# Patient Record
Sex: Female | Born: 1985
Health system: Southern US, Community
[De-identification: ages and names within clinical notes are randomized; demographics above are authoritative.]

## PROBLEM LIST (undated history)

## (undated) DIAGNOSIS — N760 Acute vaginitis: Secondary | ICD-10-CM

## (undated) DIAGNOSIS — N390 Urinary tract infection, site not specified: Secondary | ICD-10-CM

## (undated) DIAGNOSIS — E039 Hypothyroidism, unspecified: Secondary | ICD-10-CM

## (undated) DIAGNOSIS — D649 Anemia, unspecified: Secondary | ICD-10-CM

## (undated) DIAGNOSIS — R51 Headache: Secondary | ICD-10-CM

## (undated) DIAGNOSIS — O24419 Gestational diabetes mellitus in pregnancy, unspecified control: Secondary | ICD-10-CM

## (undated) DIAGNOSIS — B9689 Other specified bacterial agents as the cause of diseases classified elsewhere: Secondary | ICD-10-CM

## (undated) DIAGNOSIS — F988 Other specified behavioral and emotional disorders with onset usually occurring in childhood and adolescence: Secondary | ICD-10-CM

## (undated) DIAGNOSIS — E669 Obesity, unspecified: Secondary | ICD-10-CM

## (undated) DIAGNOSIS — T7840XA Allergy, unspecified, initial encounter: Secondary | ICD-10-CM

## (undated) DIAGNOSIS — L309 Dermatitis, unspecified: Secondary | ICD-10-CM

## (undated) HISTORY — DX: Dermatitis, unspecified: L30.9

## (undated) HISTORY — DX: Other specified behavioral and emotional disorders with onset usually occurring in childhood and adolescence: F98.8

## (undated) HISTORY — DX: Headache: R51

## (undated) HISTORY — PX: WISDOM TOOTH EXTRACTION: SHX21

## (undated) HISTORY — DX: Obesity, unspecified: E66.9

## (undated) HISTORY — DX: Allergy, unspecified, initial encounter: T78.40XA

## (undated) HISTORY — DX: Hypothyroidism, unspecified: E03.9

---

## 2007-03-07 ENCOUNTER — Other Ambulatory Visit: Admission: RE | Admit: 2007-03-07 | Discharge: 2007-03-07 | Payer: Self-pay | Admitting: Gynecology

## 2008-01-16 ENCOUNTER — Inpatient Hospital Stay (HOSPITAL_COMMUNITY): Admission: AD | Admit: 2008-01-16 | Discharge: 2008-01-18 | Payer: Self-pay | Admitting: Obstetrics and Gynecology

## 2008-11-07 ENCOUNTER — Ambulatory Visit: Payer: Self-pay | Admitting: Family Medicine

## 2009-03-10 ENCOUNTER — Ambulatory Visit: Payer: Self-pay | Admitting: Family Medicine

## 2009-03-12 ENCOUNTER — Ambulatory Visit: Payer: Self-pay | Admitting: Family Medicine

## 2009-07-31 ENCOUNTER — Ambulatory Visit: Payer: Self-pay | Admitting: Family Medicine

## 2009-09-18 ENCOUNTER — Ambulatory Visit: Payer: Self-pay | Admitting: Family Medicine

## 2009-10-24 ENCOUNTER — Ambulatory Visit: Payer: Self-pay | Admitting: Family Medicine

## 2010-01-22 ENCOUNTER — Ambulatory Visit: Payer: Self-pay | Admitting: Family Medicine

## 2010-03-10 ENCOUNTER — Ambulatory Visit: Payer: Self-pay | Admitting: Family Medicine

## 2010-04-27 ENCOUNTER — Emergency Department (HOSPITAL_COMMUNITY): Admission: EM | Admit: 2010-04-27 | Discharge: 2010-04-27 | Payer: Self-pay | Admitting: Family Medicine

## 2010-04-29 ENCOUNTER — Emergency Department (HOSPITAL_BASED_OUTPATIENT_CLINIC_OR_DEPARTMENT_OTHER): Admission: EM | Admit: 2010-04-29 | Discharge: 2010-04-29 | Payer: Self-pay | Admitting: Emergency Medicine

## 2010-10-15 LAB — POCT URINALYSIS DIPSTICK
Nitrite: NEGATIVE
Specific Gravity, Urine: 1.02 (ref 1.005–1.030)
Urobilinogen, UA: 0.2 mg/dL (ref 0.0–1.0)
pH: 6.5 (ref 5.0–8.0)

## 2010-10-15 LAB — GC/CHLAMYDIA PROBE AMP, GENITAL: Chlamydia, DNA Probe: NEGATIVE

## 2010-10-15 LAB — URINE CULTURE: Culture  Setup Time: 201109262004

## 2010-10-15 LAB — WET PREP, GENITAL: Trich, Wet Prep: NONE SEEN

## 2010-10-15 LAB — HEMOCCULT GUIAC POC 1CARD (OFFICE): Fecal Occult Bld: NEGATIVE

## 2010-10-29 ENCOUNTER — Ambulatory Visit (INDEPENDENT_AMBULATORY_CARE_PROVIDER_SITE_OTHER): Payer: 59 | Admitting: Family Medicine

## 2010-10-29 DIAGNOSIS — J309 Allergic rhinitis, unspecified: Secondary | ICD-10-CM

## 2010-12-15 NOTE — Discharge Summary (Signed)
[name], [name]                   ACCOUNT NO.:  [id number]   MEDICAL RECORD NO.:  [id number]          PATIENT TYPE:  INP   LOCATION:  9109                          FACILITY:  [facility]   PHYSICIAN:  Huel Cote, M.D. DATE OF BIRTH:  03-26-1986   DATE OF ADMISSION:  01/16/2008  DATE OF DISCHARGE:  01/18/2008                               DISCHARGE SUMMARY   DISCHARGE DIAGNOSES:  1. Term pregnancy at 24 plus weeks.  2. Vaginal delivery.   DISCHARGE MEDICATIONS:  1. Motrin 600 mg p.o. every 6 hours.  2. Percocet 1-2 tablets p.o. every 4 hours p.r.n.   DISCHARGE FOLLOW-UP:  The patient is to follow up in the office in 6  weeks for her routine postpartum exam.   HOSPITAL COURSE:  The patient is 25 year old G2 P0-0-1-0, who was  admitted at 8 plus weeks' gestation with complaint of rupture of  membranes of clear fluid.  She had a few contractions.  Prenatal care  had been uncomplicated.  She transferred in late to our practice from  [place] where she was in school.  At around 35 weeks, she did  have appropriate prenatal care with the only risk factor being an  increased risk of trisomy 28 on her second trimester screen.  Her level  II ultrasound was normal and she chose to pursue this no further.   PAST MEDICAL HISTORY:  None.   PAST SURGICAL HISTORY:  None.   PAST GYN HISTORY:  No abnormal Pap smears.   PAST OB HISTORY:  One elective abortion.   She had no allergies.   MEDICATIONS:  Prenatal vitamins only.   She had the prenatal labs as follows; A+ antibody negative, sickle  negative, RPR nonreactive, rubella immune, hepatitis B surface antigen  negative, HIV negative, GC negative, chlamydia negative, group B strep  negative.  Her 1-hour Glucola was elevated at 154 with her 3-hour was  normal.   On admission, she was afebrile with stable vital signs.  Cervix was 2-3,  80, and a -2 station with some contractions noted.  She progressed on  her own to approximately 4  cm and received an epidural.  At that point,  she had held at 4 cm for some time.  Therefore, an IUPC was placed and  she was put on Pitocin.  She continued to dilate, reached complete  dilation and felt some increased pressure.  At this point, we attempted  pushing.  However, the vertex was still quite high at a -1 station and  OP and the patient was pushing very poorly.  Therefore, we elected to  redose her epidural and place her in exaggerated Sims and hope the baby  would rotate down the birth canal.  Approximately 1 hour after that  check, I was called by the staff for delivery and the patient was found  by our nurse to be complete and +3, and before I could arrive Dr. Shawnie Pons  she was in the hospital attended the delivery approximately 4 minutes  after my page.  I reached before the placenta delivered and that  delivered shortly thereafter the baby.  The patient was examined, had a  first-degree laceration which was repaired with 2-0 Vicryl with  lidocaine block for hemostasis.  Cervix and rectum were intact.  Estimated blood loss 350 mL.  The patient was then admitted for routine  postpartum care and did quite well.  On postpartum day #1, her  hemoglobin was noted to be 9.6 down from 10 and she really was doing  quite well.      Huel Cote, M.D.  Electronically Signed     KR/MEDQ  D:  01/18/2008  T:  01/18/2008  Job:  [id number]

## 2011-02-22 ENCOUNTER — Other Ambulatory Visit: Payer: 59

## 2011-02-22 DIAGNOSIS — Z0000 Encounter for general adult medical examination without abnormal findings: Secondary | ICD-10-CM

## 2011-02-26 ENCOUNTER — Other Ambulatory Visit: Payer: 59

## 2011-02-26 DIAGNOSIS — Z0000 Encounter for general adult medical examination without abnormal findings: Secondary | ICD-10-CM

## 2011-02-26 LAB — TB SKIN TEST

## 2011-02-26 NOTE — Progress Notes (Signed)
Addended by: Dorthula Perfect on: 02/26/2011 04:44 PM   Modules accepted: Orders

## 2011-03-01 LAB — TB SKIN TEST: TB Skin Test: NEGATIVE mm

## 2011-04-21 ENCOUNTER — Ambulatory Visit: Payer: 59 | Admitting: Family Medicine

## 2011-04-23 ENCOUNTER — Encounter: Payer: Self-pay | Admitting: Family Medicine

## 2011-04-26 ENCOUNTER — Other Ambulatory Visit: Payer: Self-pay | Admitting: Family Medicine

## 2011-04-26 ENCOUNTER — Ambulatory Visit (INDEPENDENT_AMBULATORY_CARE_PROVIDER_SITE_OTHER): Payer: 59 | Admitting: Family Medicine

## 2011-04-26 ENCOUNTER — Encounter: Payer: Self-pay | Admitting: Family Medicine

## 2011-04-26 VITALS — BP 118/74 | HR 78 | Wt 228.0 lb

## 2011-04-26 DIAGNOSIS — Z23 Encounter for immunization: Secondary | ICD-10-CM

## 2011-04-26 DIAGNOSIS — R946 Abnormal results of thyroid function studies: Secondary | ICD-10-CM

## 2011-04-26 DIAGNOSIS — J069 Acute upper respiratory infection, unspecified: Secondary | ICD-10-CM

## 2011-04-26 NOTE — Progress Notes (Signed)
  Subjective:    Patient ID: [name], female    DOB: January 21, 1986, 25 y.o.   MRN: [id number]  HPI She has a one-week history of runny nose, nasal congestion, slight cough, ear congestion with popping. No sore throat or cough. She also recently saw her gynecologist and had blood work done for evaluation of symptoms of fatigue, dryness of her skin and inability to lose weight.  Review of Systems     Objective:   Physical Exam alert and in no distress. Tympanic membranes and canals are normal. Throat is clear. Tonsils are normal. Neck is supple without adenopathy or thyromegaly. Cardiac exam shows a regular sinus rhythm without murmurs or gallops. Lungs are clear to auscultation. Nasal mucosa is normal with no tenderness over sinuses. DTRs are normal. The blood work was reviewed and does show a slightly elevated TSH with other indices being normal.       Assessment & Plan:   1. Nonspecific abnormal results of thyroid function study  Thyroid Panel with TSH, Thyroid peroxidase antibody  2. URI, acute     Supportive care for the URI.

## 2011-04-27 ENCOUNTER — Telehealth: Payer: Self-pay

## 2011-04-27 LAB — THYROID PANEL WITH TSH - CHCC: T3 Uptake: 26.4 % (ref 22.5–37.0)

## 2011-04-27 NOTE — Telephone Encounter (Signed)
Pt has appt to discuss labs per [employer]

## 2011-04-29 LAB — CBC
HCT: 29 — ABNORMAL LOW
HCT: 29.5 — ABNORMAL LOW
Hemoglobin: 10 — ABNORMAL LOW
Hemoglobin: 9.6 — ABNORMAL LOW
MCHC: 33.2
MCHC: 33.8
MCV: 86.7
MCV: 88.2
Platelets: 270
RBC: 3.4 — ABNORMAL LOW
RDW: 16.2 — ABNORMAL HIGH
WBC: 7.3

## 2011-04-29 LAB — RPR: RPR Ser Ql: NONREACTIVE

## 2011-05-03 ENCOUNTER — Ambulatory Visit (INDEPENDENT_AMBULATORY_CARE_PROVIDER_SITE_OTHER): Payer: 59 | Admitting: Family Medicine

## 2011-05-03 ENCOUNTER — Encounter: Payer: Self-pay | Admitting: Family Medicine

## 2011-05-03 VITALS — BP 100/70 | HR 84 | Wt 231.0 lb

## 2011-05-03 DIAGNOSIS — E669 Obesity, unspecified: Secondary | ICD-10-CM

## 2011-05-03 DIAGNOSIS — E039 Hypothyroidism, unspecified: Secondary | ICD-10-CM | POA: Insufficient documentation

## 2011-05-03 MED ORDER — LEVOTHYROXINE SODIUM 100 MCG PO TABS: 100.0000 ug | ORAL_TABLET | Freq: Every day | ORAL | Status: DC

## 2011-05-03 NOTE — Progress Notes (Signed)
  Subjective:    Patient ID: [name], female    DOB: 1985-10-16, 25 y.o.   MRN: [id number]  HPI She is here for consult. Recent blood work did show slightly elevated TSH. Her TPO was also elevated. She also has concerns over this contributing to her weight gain.   Review of Systems     Objective:   Physical Exam Alert and in no distress otherwise not examined       Assessment & Plan:   1. Hypothyroid   2. Obesity (BMI 30-39.9)    I discussed the thyroid condition and the implications of TPO with her. I will place her on Synthroid and recommend she return here in 2 months for recheck. We also discussed her weight gain in regard to making permanent lifestyle changes. Encouraged her to not use the scales as a measure of how well she is doing.

## 2011-05-03 NOTE — Patient Instructions (Signed)
Take her thyroid medicine on a daily basis. Check with the pharmacist on the best time to take it and with or without food Work on making diet and exercise changes but do it permanently. Don't ever hop on another scale normal . See how your clothes fitting

## 2011-06-01 ENCOUNTER — Other Ambulatory Visit: Payer: Self-pay | Admitting: Internal Medicine

## 2011-06-01 DIAGNOSIS — R109 Unspecified abdominal pain: Secondary | ICD-10-CM

## 2011-06-04 ENCOUNTER — Ambulatory Visit
Admission: RE | Admit: 2011-06-04 | Discharge: 2011-06-04 | Disposition: A | Discharge status: Home / Self Care | Payer: 59 | Source: Ambulatory Visit | Attending: Internal Medicine | Admitting: Internal Medicine

## 2011-06-04 DIAGNOSIS — R109 Unspecified abdominal pain: Secondary | ICD-10-CM

## 2011-07-06 ENCOUNTER — Ambulatory Visit: Payer: 59 | Admitting: Family Medicine

## 2011-07-29 ENCOUNTER — Ambulatory Visit: Payer: 59 | Admitting: Family Medicine

## 2011-10-03 ENCOUNTER — Other Ambulatory Visit: Payer: Self-pay | Admitting: Family Medicine

## 2011-10-04 ENCOUNTER — Telehealth: Payer: Self-pay | Admitting: Internal Medicine

## 2011-10-04 MED ORDER — LEVOTHYROXINE SODIUM 100 MCG PO TABS: 100.0000 ug | ORAL_TABLET | Freq: Every day | ORAL | Status: DC

## 2011-10-04 NOTE — Telephone Encounter (Signed)
cheri refilled it with a 30 day supply so i just resent it for a 90 day supply with no refills

## 2011-11-11 ENCOUNTER — Encounter (HOSPITAL_COMMUNITY): Payer: Self-pay | Admitting: Emergency Medicine

## 2011-11-11 ENCOUNTER — Emergency Department (INDEPENDENT_AMBULATORY_CARE_PROVIDER_SITE_OTHER)
Admission: EM | Admit: 2011-11-11 | Discharge: 2011-11-11 | Disposition: A | Discharge status: Home / Self Care | Payer: Self-pay | Source: Home / Self Care | Attending: Emergency Medicine | Admitting: Emergency Medicine

## 2011-11-11 DIAGNOSIS — A6000 Herpesviral infection of urogenital system, unspecified: Secondary | ICD-10-CM

## 2011-11-11 HISTORY — DX: Other specified bacterial agents as the cause of diseases classified elsewhere: B96.89

## 2011-11-11 HISTORY — DX: Other specified bacterial agents as the cause of diseases classified elsewhere: N76.0

## 2011-11-11 HISTORY — DX: Urinary tract infection, site not specified: N39.0

## 2011-11-11 LAB — POCT URINALYSIS DIP (DEVICE)
Nitrite: NEGATIVE
Protein, ur: 30 mg/dL — AB
Urobilinogen, UA: 1 mg/dL (ref 0.0–1.0)
pH: 6.5 (ref 5.0–8.0)

## 2011-11-11 LAB — POCT PREGNANCY, URINE: Preg Test, Ur: NEGATIVE

## 2011-11-11 LAB — WET PREP, GENITAL: Yeast Wet Prep HPF POC: NONE SEEN

## 2011-11-11 MED ORDER — CEFTRIAXONE SODIUM 250 MG IJ SOLR
250.0000 mg | Freq: Once | INTRAMUSCULAR | Status: AC
  Administered 2011-11-11: 250 mg via INTRAMUSCULAR

## 2011-11-11 MED ORDER — LIDOCAINE 4 % EX GEL: 1.0000 "application " | Freq: Three times a day (TID) | CUTANEOUS | Status: DC | PRN

## 2011-11-11 MED ORDER — AZITHROMYCIN 250 MG PO TABS
ORAL_TABLET | ORAL | Status: AC
  Filled 2011-11-11: qty 4

## 2011-11-11 MED ORDER — CEFTRIAXONE SODIUM 250 MG IJ SOLR
INTRAMUSCULAR | Status: AC
  Filled 2011-11-11: qty 250

## 2011-11-11 MED ORDER — AZITHROMYCIN 250 MG PO TABS
1000.0000 mg | ORAL_TABLET | Freq: Once | ORAL | Status: AC
  Administered 2011-11-11: 1000 mg via ORAL

## 2011-11-11 MED ORDER — ACYCLOVIR 400 MG PO TABS: 400.0000 mg | ORAL_TABLET | Freq: Three times a day (TID) | ORAL | Status: AC

## 2011-11-11 NOTE — Discharge Instructions (Signed)
Take the medication as written. Give us a working phone number so that we can contact you if needed. Refrain from sexual contact until you know your results and your partner(s) are treated. Return if you get worse, have a fever >100.4, or for any concerns.  ° °Go to www.goodrx.com to look up your medications. This will give you a list of where you can find your prescriptions at the most affordable prices.  °

## 2011-11-11 NOTE — ED Provider Notes (Signed)
History     CSN: [id number]  Arrival date & time 11/11/11  1007   First MD Initiated Contact with Patient 11/11/11 1054      Chief Complaint  Patient presents with  . Abdominal Pain  . Vaginal Discharge    (Consider location/radiation/quality/duration/timing/severity/associated sxs/prior treatment) HPI Comments: Pt with several days oderous vaginal discharge, dysuria. Notes genital blisters, vaginal itching.  No urgency, frequency,  oderous urine, hematuria. Patient reports fevers Tmax 101 about a week ago, with some generalized body aches. This has since resolved. Complains of occasional N/V, intermittent, sharp, nonradiating lower abdominal pain starting around this time. He is tolerating by mouth. No back pain. No recent abx use. Pt sexually active with new female partner who is  asxatic. Did not use condoms. STD's a concern today. Has a history of BV. No history of gonorrhea chlamydia trichmonoas yeast infection. No h/o syphilis, herpes, HIV.    Patient is a 26 y.o. female presenting with abdominal pain and vaginal discharge. The history is provided by the patient. No language interpreter was used.  Abdominal Pain The primary symptoms of the illness include abdominal pain, dysuria and vaginal discharge. The primary symptoms of the illness do not include fever or vaginal bleeding. The current episode started more than 2 days ago. The onset of the illness was sudden.  The dysuria is associated with vaginal pain. The dysuria is not associated with hematuria, frequency or urgency.  The vaginal discharge is associated with dysuria.  The patient states that she believes she is currently not pregnant. The patient has not had a change in bowel habit. Symptoms associated with the illness do not include urgency, hematuria or frequency.  Vaginal Discharge Associated symptoms include abdominal pain.    Past Medical History  Diagnosis Date  . Eczema   . Allergy     RHINITIS  . Headache   .  ADD (attention deficit disorder)   . BV (bacterial vaginosis)   . UTI (lower urinary tract infection)     History reviewed. No pertinent past surgical history.  Family History  Problem Relation Age of Onset  . Hypertension Maternal Grandmother   . Heart disease Maternal Grandmother   . Kidney disease Maternal Grandmother     History  Substance Use Topics  . Smoking status: Never Smoker   . Smokeless tobacco: Never Used  . Alcohol Use: No    OB History    Grav Para Term Preterm Abortions TAB SAB Ect Mult Living                  Review of Systems  Constitutional: Negative for fever.  HENT: Negative for sore throat and mouth sores.   Gastrointestinal: Positive for abdominal pain.  Genitourinary: Positive for dysuria, vaginal discharge and vaginal pain. Negative for urgency, frequency, hematuria, flank pain and vaginal bleeding.  Musculoskeletal: Negative for arthralgias.  Skin: Positive for rash.    Allergies  Review of patient's allergies indicates no known allergies.  Home Medications   Current Outpatient Rx  Name Route Sig Dispense Refill  . ACYCLOVIR 400 MG PO TABS Oral Take 1 tablet (400 mg total) by mouth 3 (three) times daily. X 10 days 30 tablet 0  . LEVOTHYROXINE SODIUM 100 MCG PO TABS Oral Take 1 tablet (100 mcg total) by mouth daily. 90 tablet 0  . LIDOCAINE 4 % EX GEL Apply externally Apply 1 application topically 3 (three) times daily as needed. Apply with swab. Use smallest effective amount.. Do not exceed  5 gm. 10 g 0  . LORATADINE 10 MG PO TABS Oral Take 10 mg by mouth daily.        BP 137/77  Pulse 62  Temp(Src) 97.9 F (36.6 C) (Oral)  Resp 14  SpO2 100%  LMP 10/15/2011  Physical Exam  Nursing note and vitals reviewed. Constitutional: She is oriented to person, place, and time. She appears well-developed and well-nourished. No distress.  HENT:  Head: Normocephalic and atraumatic.  Eyes: EOM are normal.  Neck: Normal range of motion.    Cardiovascular: Normal rate.   Pulmonary/Chest: Effort normal.  Abdominal: Soft. Bowel sounds are normal. She exhibits no distension. There is no tenderness. There is no rebound, no guarding and no CVA tenderness.  Genitourinary: Uterus normal. Pelvic exam was performed with patient supine. There is rash on the right labia. There is rash on the left labia. Uterus is not tender. Cervix exhibits no motion tenderness and no friability. Right adnexum displays no mass, no tenderness and no fullness. Left adnexum displays no mass, no tenderness and no fullness. No erythema, tenderness or bleeding around the vagina. No foreign body around the vagina. Vaginal discharge found.       Multiple painful, shallow ulcers on the left labia, along vaginal walls, and on cervix. Yellowish and nonodorous vaginal discharge. .Chaperone present during exam  Musculoskeletal: Normal range of motion.  Lymphadenopathy:       Right: No inguinal adenopathy present.       Left: No inguinal adenopathy present.  Neurological: She is alert and oriented to person, place, and time.  Skin: Skin is warm and dry.  Psychiatric: She has a normal mood and affect. Her behavior is normal. Judgment and thought content normal.    ED Course  Procedures (including critical care time)  Labs Reviewed  POCT URINALYSIS DIP (DEVICE) - Abnormal; Notable for the following:    Hgb urine dipstick SMALL (*)    Protein, ur 30 (*)    Leukocytes, UA SMALL (*) Biochemical Testing Only. Please order routine urinalysis from main lab if confirmatory testing is needed.   All other components within normal limits  POCT PREGNANCY, URINE  GC/CHLAMYDIA PROBE AMP, GENITAL  WET PREP, GENITAL  HERPES SIMPLEX VIRUS CULTURE  RPR  HIV ANTIBODY (ROUTINE TESTING)  HSV 2 ANTIBODY, IGG   No results found.   1. Primary genital herpes simplex infection       MDM  H&P most c/w  herpes .  Sent off GC/chlamydia, wet prep, herpes culture. Unsure if got  adequate sample, but patient has painful ulcers on the vaginal wall and on her cervix. Patient declined HIV, RPR. Will  treat empirically now. Giving ceftriaxone 250 mg IM/azithro 1 gm po. Will send home with acyclovir and topical anesthetics. Advised pt to refrain from sexual contact until she knows lab results, symptoms resolve, and partner(s) are treated. Pt provided working phone number. Pt agrees.     Luiz Blare, MD 11/11/11 [phone]

## 2011-11-11 NOTE — ED Notes (Signed)
WENT TO OBTAIN BLOOD SPECIMEN, PATIENT REFUSED TEST

## 2011-11-11 NOTE — ED Notes (Signed)
PT HERE WITH LOWER ABDOMINAL PAIN ,NONRADIATING WITH YELLOW VAG D/C,ODOR AND INTERMITT N/V,CHILLS AND FEVER THAT STARTED A WEEK AGO.PT STATES SHE HAD UNPROTECTED SEX X 3WEEK AGO.LMP 10/15/11.PT ALSO REPORTS OF BURN,ITCH AND VAG IRRITATION

## 2011-11-12 ENCOUNTER — Ambulatory Visit: Payer: 59 | Admitting: Medical

## 2011-11-15 LAB — HERPES SIMPLEX VIRUS CULTURE

## 2011-11-15 NOTE — ED Notes (Signed)
GC Indeterminate, Chlamydia neg., Wet prep: Few clue cells, WBC's TNTC, Herpes culture pending.  Pt. adequately treated with Rocephin and Zithromax. No DHHS required for Indeterminate results. Will contact pt. when all of labs are back. [name] 11/15/2011

## 2011-11-16 ENCOUNTER — Telehealth (HOSPITAL_COMMUNITY): Payer: Self-pay | Admitting: *Deleted

## 2011-11-16 NOTE — ED Notes (Signed)
Herpes culture: Herpes simplex Type 2 detected. Pt. adequately treated with Acyclovir.  I called and left a message for pt. to call. [name] 11/16/2011

## 2011-11-16 NOTE — ED Notes (Signed)
Pt. called back.  Pt. verified x 2 and given results. Pt. told she was adequately treated with the Rocephin, Zithromax and Zovirax. Pt. instructed to notify her partner, no sex for 1 week and to practice safe sex. Pt. told they can get HIV testing at the [facility]. STD clinic. Pt. Instructed that she can pass the virus even when you don't have an outbreak, so always practice safe sex. Get treated for each outbreak with Acyclovir or Valtrex. You may want to get an OB-GYN doctor who can call in a prescription for you when you have an outbreak. [name] 11/16/2011

## 2011-11-17 ENCOUNTER — Telehealth (HOSPITAL_COMMUNITY): Payer: Self-pay | Admitting: *Deleted

## 2011-11-17 NOTE — ED Notes (Signed)
Dr. Chaney Malling notified of pt.'s question and concern and agreed with what I told the pt. She did not tell me tell her anything else concerning this. [name] 11/17/2011

## 2011-11-17 NOTE — ED Notes (Addendum)
4/16 Called on VM but could not hear name.   4/17  I called back, but did not leave a message when I realized who's number it was. [name] called me back. I told her I had already talked to her yesterday and gave her the results. I explained that, her phone number was on my VM, but could not hear her name and to disregard my call back today.  She said she had a question. She wants to know when the sores will go away. I explained that it will go away quicker now that she is on the Acyclovir. I asked how it was doing. She said they started draining today. I said it is Day 5 and told her that was good, now that they have opened and drained they will dry up and go away.  I told I could not tell her exactly when, but probably within a week. Pt. crying again and said it is really depressing her. I asked if she had PCP and she said yes. I told her to talk to him. She said she has gone to him all her life and is to embarrassed to tell him. I explained that is why you have a doctor to help when you have a problem. I told her to talk to him ask him her questions. The more information she has the less anxiety she will have and if he feels like she needs more counseling, he will know where to direct her to go. [name] 11/17/2011

## 2012-01-29 ENCOUNTER — Other Ambulatory Visit: Payer: Self-pay | Admitting: Family Medicine

## 2012-03-01 ENCOUNTER — Other Ambulatory Visit: Payer: Self-pay | Admitting: Family Medicine

## 2012-08-14 ENCOUNTER — Encounter: Payer: Self-pay | Admitting: Medical

## 2012-08-14 ENCOUNTER — Ambulatory Visit (INDEPENDENT_AMBULATORY_CARE_PROVIDER_SITE_OTHER): Payer: Managed Care, Other (non HMO) | Admitting: Medical

## 2012-08-14 VITALS — BP 116/75 | HR 72 | Temp 98.3°F | Resp 18 | Wt 250.0 lb

## 2012-08-14 DIAGNOSIS — J029 Acute pharyngitis, unspecified: Secondary | ICD-10-CM

## 2012-08-14 DIAGNOSIS — H9209 Otalgia, unspecified ear: Secondary | ICD-10-CM

## 2012-08-14 DIAGNOSIS — H9203 Otalgia, bilateral: Secondary | ICD-10-CM

## 2012-08-14 LAB — POCT RAPID STREP A (OFFICE): Rapid Strep A Screen: NEGATIVE

## 2012-08-14 NOTE — Progress Notes (Signed)
Subjective: Here for c/o sore throat, ear pain, both ears.   Son had ear infection a week ago, wonders if she picked this up from him.   Been feeling bad 3-4 days.   She notes mild cough, intermittent.  Denies fever, no nausea, vomiting, no sinus pressure, no chest or head congestion.  Using nothing for the symptoms.   No other aggravating or relieving factors.   Past Medical History  Diagnosis Date  . Eczema   . Allergy     RHINITIS  . Headache   . ADD (attention deficit disorder)   . BV (bacterial vaginosis)   . UTI (lower urinary tract infection)    ROS as in subjective  Objective: Gen: wd, wn, nad Skin: warm, dry Heent: sinuses nontender, TMs pearly, nares with right side swollen turbinate, clear discharge Oral: MMM, pharynx with erythema, left tonsil mildly swollen with erythema, no exudate, uvular swollen and with erythema, no other lesions Neck: supple, shoddy tender anterior lymphadenopathy, no mass or thyromegaly Lungs: cta   Assessment: Encounter Diagnoses  Name Primary?  . Pharyngitis Yes  . Otalgia of both ears     Plan: Strep swab negative. Advised that symptoms and exam suggest viral etiology.  Discussed supportive care.  Call/return if worse or not improving.

## 2012-08-14 NOTE — Patient Instructions (Signed)

## 2015-07-16 ENCOUNTER — Ambulatory Visit (INDEPENDENT_AMBULATORY_CARE_PROVIDER_SITE_OTHER): Payer: Self-pay | Admitting: Medical

## 2015-07-16 ENCOUNTER — Encounter: Payer: Self-pay | Admitting: Medical

## 2015-07-16 VITALS — BP 110/72 | HR 74 | Resp 12 | Wt 265.4 lb

## 2015-07-16 DIAGNOSIS — Z8249 Family history of ischemic heart disease and other diseases of the circulatory system: Secondary | ICD-10-CM

## 2015-07-16 DIAGNOSIS — E669 Obesity, unspecified: Secondary | ICD-10-CM

## 2015-07-16 DIAGNOSIS — O99210 Obesity complicating pregnancy, unspecified trimester: Secondary | ICD-10-CM | POA: Insufficient documentation

## 2015-07-16 DIAGNOSIS — Z139 Encounter for screening, unspecified: Secondary | ICD-10-CM

## 2015-07-16 DIAGNOSIS — D509 Iron deficiency anemia, unspecified: Secondary | ICD-10-CM

## 2015-07-16 DIAGNOSIS — Z833 Family history of diabetes mellitus: Secondary | ICD-10-CM

## 2015-07-16 DIAGNOSIS — Z0000 Encounter for general adult medical examination without abnormal findings: Secondary | ICD-10-CM

## 2015-07-16 LAB — POCT URINALYSIS DIPSTICK
Bilirubin, UA: NEGATIVE
GLUCOSE UA: NEGATIVE
KETONES UA: NEGATIVE
LEUKOCYTES UA: NEGATIVE
Nitrite, UA: NEGATIVE
Protein, UA: NEGATIVE
UROBILINOGEN UA: NEGATIVE
pH, UA: 6

## 2015-07-16 LAB — POCT HEMOGLOBIN: Hemoglobin: 9.4 g/dL — AB (ref 12.2–16.2)

## 2015-07-16 LAB — POCT GLYCOSYLATED HEMOGLOBIN (HGB A1C): HEMOGLOBIN A1C: 5.8

## 2015-07-16 MED ORDER — FERROUS GLUCONATE 324 (38 FE) MG PO TABS: 324.0000 mg | ORAL_TABLET | Freq: Two times a day (BID) | ORAL | Status: DC

## 2015-07-16 MED ORDER — FLUCONAZOLE 150 MG PO TABS: ORAL_TABLET | ORAL | Status: DC

## 2015-07-16 NOTE — Progress Notes (Signed)
Subjective:   HPI  [name] is a 29 y.o. female who presents for a complete physical.  She [facility].   She recently had a flu shot.  She currently works as  LawyerCNA.   She does note being told of possible hypothyroidism in the past.   Wonders about fibroids due to abdominal cramping at times.  Denies problems with urination, bowel movements or pelvic pain, has regular non heavy periods.  Not currently sexually active.  Last STD testing and pap about a year ago at [facility], normal per her report.  Stays fatigued a lot, has trouble losing weight. No other aggravating or relieving factors. No other complaint.  Reviewed their medical, surgical, family, social, medication, and allergy history and updated chart as appropriate.  Past Medical History  Diagnosis Date  . Eczema   . Allergy     RHINITIS  . Headache(784.0)     intermittent  . ADD (attention deficit disorder)   . BV (bacterial vaginosis)   . UTI (lower urinary tract infection)     not frequently  . Hypothyroidism   . Obesity     Past Surgical History  Procedure Laterality Date  . Wisdom tooth extraction      Social History   Social History  . Marital Status: Single    Spouse Name: N/A  . Number of Children: N/A  . Years of Education: N/A   Occupational History  . Not on file.   Social History Main Topics  . Smoking status: Never Smoker   . Smokeless tobacco: Never Used  . Alcohol Use: Yes  . Drug Use: No  . Sexual Activity: Yes    Birth Control/ Protection: None   Other Topics Concern  . Not on file   Social History Narrative   Lives at home with 7yo son.    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  [place]  Attending [employer] nursing program 08/2015.   Exercise - walking on the job.      Family History  Problem Relation Age of Onset  . Hypertension Maternal Grandmother   . Heart disease Maternal Grandmother   . Kidney disease  Maternal Grandmother   . Diabetes Maternal Grandmother   . Fibromyalgia Mother   . Lupus Mother   . Obesity Mother   . Deep vein thrombosis Father   . Hypertension Father   . Diabetes Father   . Obesity Father   . Asthma Sister   . Hypertension Maternal Grandfather   . Cancer Maternal Grandfather   . Alzheimer's disease Paternal Grandmother   . Diabetes Paternal Grandfather   . Seizures Sister      Current outpatient prescriptions:  .  ferrous gluconate (FERGON) 324 MG tablet, Take 1 tablet (324 mg total) by mouth 2 (two) times daily with a meal., Disp: 60 tablet, Rfl: 3 .  fluconazole (DIFLUCAN) 150 MG tablet, 1 tablet now and can repeat in 1 week, Disp: 2 tablet, Rfl: 0  No Known Allergies   Review of Systems Constitutional: -fever, -chills, -sweats, -unexpected weight change, -decreased appetite, -fatigue Allergy: -sneezing, -itching, -congestion Dermatology: -changing moles, --rash, -lumps ENT: -runny nose, -ear pain, -sore throat, -hoarseness, -sinus pain, -teeth pain, - ringing in ears, -hearing loss, -nosebleeds Cardiology: -chest pain, -palpitations, -swelling, -difficulty breathing when lying flat, -waking up short of breath Respiratory: -cough, -shortness of breath, -difficulty breathing with exercise or exertion, -wheezing, -coughing up  blood Gastroenterology: +abdominal pain, -nausea, -vomiting, -diarrhea, -constipation, -blood in stool, -changes in bowel movement, -difficulty swallowing or eating Hematology: -bleeding, -bruising  Musculoskeletal: -joint aches, -muscle aches, -joint swelling, -back pain, -neck pain, -cramping, -changes in gait Ophthalmology: denies vision changes, eye redness, itching, discharge Urology: -burning with urination, -difficulty urinating, -blood in urine, -urinary frequency, -urgency, -incontinence Neurology: -headache, -weakness, -tingling, -numbness, -memory loss, -falls, -dizziness Psychology: -depressed mood, -agitation, -sleep  problems     Objective:   Physical Exam  BP 110/72 mmHg  Pulse 74  Resp 12  Wt 265 lb 6.4 oz (120.385 kg)  General appearance: alert, no distress, WD/WN, obese AA female Skin: no worrisome lesions HEENT: normocephalic, conjunctiva/corneas normal, sclerae anicteric, PERRLA, EOMi, nares patent, no discharge or erythema, pharynx normal Oral cavity: MMM, tongue normal, teeth normal Neck: supple, no lymphadenopathy, no thyromegaly, no masses, normal ROM, on bruits Chest: non tender, normal shape and expansion Heart: RRR, normal S1, S2, no murmurs Lungs: CTA bilaterally, no wheezes, rhonchi, or rales Abdomen: +bs, soft, non tender, non distended, no masses, no hepatomegaly, no splenomegaly, no bruits Back: non tender, normal ROM, no scoliosis Musculoskeletal: upper extremities non tender, no obvious deformity, normal ROM throughout, lower extremities non tender, no obvious deformity, normal ROM throughout Extremities: no edema, no cyanosis, no clubbing Pulses: 2+ symmetric, upper and lower extremities, normal cap refill Neurological: alert, oriented x 3, CN2-12 intact, strength normal upper extremities and lower extremities, sensation normal throughout, DTRs 2+ throughout, no cerebellar signs, gait normal Psychiatric: normal affect, behavior normal, pleasant  Breast: nontender, no masses or lumps, no skin changes, no nipple discharge or inversion, no axillary lymphadenopathy Gyn: Normal external genitalia without lesions, vagina with normal mucosa, cervix without lesions, no cervical motion tenderness, mild white creamy odorous abnormal vaginal discharge.  Uterus and adnexa not enlarged, nontender, no masses.   Exam chaperoned by nurse. Rectal: deferred    Assessment and Plan :    Encounter Diagnoses  Name Primary?  . Encounter for health maintenance examination in adult Yes  . Obesity   . Family history of DVT   . Family history of diabetes mellitus   . Screening for condition   .  Anemia, iron deficiency     Physical exam - discussed healthy lifestyle, diet, exercise, preventative care, vaccinations, and addressed their concerns.  Handout given. Obesity - discussed need to work on weight loss through healthy diet and exercise Advised routine labs today. Will try and get copy of last pap from a year ago C/t yearly flu shot Advised lab for hep B immunity.  She plans to return for additional labs in 326mo when she has insurance Reviewed vaccines and she is up to date on Td and varicella Completed her school form Wet prep +yeast.  Begin diflucan x 1. Anemia - begin iron BID Follow-up 326mo

## 2015-08-21 ENCOUNTER — Encounter: Payer: Self-pay | Admitting: Family Medicine

## 2015-10-20 ENCOUNTER — Ambulatory Visit: Payer: Self-pay | Admitting: Medical

## 2015-10-23 ENCOUNTER — Encounter: Payer: Self-pay | Admitting: Family Medicine

## 2015-10-23 ENCOUNTER — Ambulatory Visit (INDEPENDENT_AMBULATORY_CARE_PROVIDER_SITE_OTHER): Payer: 59 | Admitting: Family Medicine

## 2015-10-23 VITALS — BP 104/70 | HR 84 | Temp 99.1°F | Ht 65.0 in | Wt 264.8 lb

## 2015-10-23 DIAGNOSIS — J029 Acute pharyngitis, unspecified: Secondary | ICD-10-CM | POA: Diagnosis not present

## 2015-10-23 DIAGNOSIS — D509 Iron deficiency anemia, unspecified: Secondary | ICD-10-CM | POA: Diagnosis not present

## 2015-10-23 LAB — POCT MONO (EPSTEIN BARR VIRUS): MONO, POC: NEGATIVE

## 2015-10-23 LAB — POCT HEMOGLOBIN: Hemoglobin: 11.3 g/dL — AB (ref 12.2–16.2)

## 2015-10-23 LAB — POCT RAPID STREP A (OFFICE): RAPID STREP A SCREEN: NEGATIVE

## 2015-10-23 MED ORDER — AMOXICILLIN 875 MG PO TABS: 875.0000 mg | ORAL_TABLET | Freq: Two times a day (BID) | ORAL | Status: DC

## 2015-10-23 NOTE — Patient Instructions (Signed)
  Your mono test and strep test was negative.  We are sending off another test to confirm whether or not this is strep. We are going to treat you presumptively for strep throat until that test comes back. If the test is negative, you don't need to finish the course of antibiotics--we will contact you with your results.  Continue to take the iron daily with food.  If you can't remember the second dose, then at least during the week of you cycle, be sure to take it twice daily.  Your anemia has improved, just mild now.

## 2015-10-23 NOTE — Progress Notes (Signed)
Chief Complaint  Patient presents with  . Sore Throat    and swollen tonsils, says she has white stuff in the back of her throat too. Wants to see if she can have referral to ENT for consult OZ:HYQMVHQIONGEXre:tonsillectomy.   . ADHD    put on adderall years ago by Dr.Lalonde (greater than 5 years ago) she wanted to get back on meds since she is back on school. I did tell her that she should schedule appt with him.   . Medication Refill    also takes iron 325, it is rx'd to be taken twice daily, she usually forgets the second dosage and wants to know if there is a higher dose that she could just take once daily.     She reports getting frequent strep throats, at least once, sometimes twice a year.  She started getting strep when her son starting getting strep.  He had his tonsils out and hasn't had problems since (about 4-5 years ago).  She reports going to other providers (her mother's doctor) for these illnesses, has not been seen here.  She reports often it was strep, but not always.  She started with a recurrent sore throat 3 days ago. She woke up with it sore, then she noticed swollen tonsils, white stuff in the throat.  No known fevers or chills.  Not currently complaining of any runny nose, sneezing, postnasal drainage or cough.   H/o anemia--last checked in December with FS Hg of 9.4. She has been taking iron tablets but often just once daily rather than twice. She can't tell if her energy has improved since then or not.  She was due to f/u with Vincenza HewsShane in 3 months, and hasn't yet scheduled this. 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  Previously saw Dr. Susann GivensLalonde, but [facility]. She was asking about ADHD meds, but was told that we would not address this today.  No new partners or risk for STD.  PMH, PSH, SH reviewed  Outpatient Encounter Prescriptions as of 10/23/2015  Medication Sig Note  . ferrous gluconate (FERGON) 324 MG tablet  Take 1 tablet (324 mg total) by mouth 2 (two) times daily with a meal. 10/23/2015: Once daily  . [DISCONTINUED] fluconazole (DIFLUCAN) 150 MG tablet 1 tablet now and can repeat in 1 week    No facility-administered encounter medications on file as of 10/23/2015.   No Known Allergies  ROS:  No chills, sweats, ear pain, cough, sinus pain, nausea, vomiting, diarrhea, rash, bleeding, bruising, chest pain, shortness of breath, dizziness or other complaints except as noted in HPI.  PHYSICAL EXAM: BP 104/70 mmHg  Pulse 84  Temp(Src) 99.1 F (37.3 C) (Tympanic)  Ht 5\' 5"  (1.651 m)  Wt 264 lb 12.8 oz (120.112 kg)  BMI 44.06 kg/m2  LMP 10/09/2015  Well appearing female, in no distress HEENT: PERRL, EOMI, conjunctiva and sclear clear. Nasal mucosa is mildly edematous, pale with clear mucus noted on the left. TMs' and EAC's normal.  OP: tonsils are mild-mod enlarged bilaterally, slightly red, symmetric.  There is white exudate overlying the tonsil on the left Neck: tender, enlarged anterior cervical lymph nodes bilaterally Heart: regular rate and rhythm Lungs: clear bilaterally Skin: normal turgor, no visible rash Psych: normal mood, affect, hygiene, grooming.  No hyperactivity Neuro: alert and oriented, cranial nerves intact. Normal strength, gait   Rapid strep negative FS HG  11.3 FS mono  negative  ASSESSMENT/PLAN:  Acute pharyngitis, unspecified etiology - rapid strep neg; confirm with cx. treat presumptively while cx pending - Plan: amoxicillin (AMOXIL) 875 MG tablet, Strep A DNA Probe  Sore throat - Plan: Rapid Strep A, Mono (Epstein Barr Virus), Strep A DNA Probe  Anemia, iron deficiency - improving; continue daily iron, BID when can remember or during menstrual cycle - Plan: Hemoglobin   monospot negative, send for strep DNA probe/culture and treat presumptively for strep  Given lack of any documentation regarding strep diagnoses and frequency, cannot recommend tonsillectomy at  this point.  She can discuss with her PCP in more detail, but records would be needed from other treating physicians.  Schedule f/u with regular PCP (last seen [facility] for [facility]PE) to f/u on anemia, and to discuss her ADHD, as well as her request for ENT referral.  Your mono test and strep test was negative.  We are sending off another test to confirm whether or not this is strep. We are going to treat you presumptively for strep throat until that test comes back. If the test is negative, you don't need to finish the course of antibiotics--we will contact you with your results.  Continue to take the iron daily with food.  If you can't remember the second dose, then at least during the week of you cycle, be sure to take it twice daily.  Your anemia has improved, just mild now.

## 2015-10-24 ENCOUNTER — Encounter: Payer: Self-pay | Admitting: Internal Medicine

## 2015-10-24 LAB — STREP A DNA PROBE: GASP: NOT DETECTED

## 2015-10-24 NOTE — Addendum Note (Signed)
Addended by: Herminio CommonsJOHNSON, SABRINA A on: 10/24/2015 12:16 PM   Modules accepted: Kipp BroodSmartSet

## 2015-10-31 ENCOUNTER — Ambulatory Visit (INDEPENDENT_AMBULATORY_CARE_PROVIDER_SITE_OTHER): Payer: 59 | Admitting: Medical

## 2015-10-31 ENCOUNTER — Encounter: Payer: Self-pay | Admitting: Medical

## 2015-10-31 VITALS — BP 120/72 | HR 68 | Wt 267.8 lb

## 2015-10-31 DIAGNOSIS — F4323 Adjustment disorder with mixed anxiety and depressed mood: Secondary | ICD-10-CM | POA: Diagnosis not present

## 2015-10-31 DIAGNOSIS — Z566 Other physical and mental strain related to work: Secondary | ICD-10-CM

## 2015-10-31 DIAGNOSIS — F909 Attention-deficit hyperactivity disorder, unspecified type: Secondary | ICD-10-CM

## 2015-10-31 DIAGNOSIS — F988 Other specified behavioral and emotional disorders with onset usually occurring in childhood and adolescence: Secondary | ICD-10-CM

## 2015-10-31 MED ORDER — AMPHETAMINE-DEXTROAMPHETAMINE 10 MG PO TABS: 10.0000 mg | ORAL_TABLET | Freq: Two times a day (BID) | ORAL | Status: DC

## 2015-10-31 NOTE — Progress Notes (Signed)
Subjective: Chief Complaint  Patient presents with  . consult    follow-up on labs, wants to be put on ADD meds. used to be on it by Dr. Susann Givens   Here for concerns about ADD.  Has seen Dr. Susann Givens for this years ago.   Has been dealing with it without medication, but since starting back with school, hard to focus, feeling stressed and forgetful.   At her clinical recently, left her car door open the whole day by accident.  Last was on medication 2013, had eventually run out of medication, didn't have insurance any more, so didn't bother with it at the time.   In general feels down at times feeling overwhelmed, but not down all the time.   No prior medication for mood.   Last medication was Adderall.  Has seen counseling before at prior school.   Main stressors currently is school, staying above water, lives at home with her son, 7yo.  Her parents help watch him when he gets out of school.   Exercise - not at the time.   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  Grades not suffering yet, but feels like she could do a lot better if more focuses.   Is in the library for several hours up to 5 hours often, but only feels like she gets 2 good hours due to focus and inattention problems.  Family history - on father's side, thinks possibly some with bipolar and depression.   First diagnosed by Dr. Susann Givens here 2012.  Currently school schedule is 3-7:45 M, W, clinicals on Tuesday most of the day, off Thursday, and works the whole weekend.   No other aggravating or relieving factors. No other complaint.  Past Medical History  Diagnosis Date  . Eczema   . Allergy     RHINITIS  . Headache(784.0)     intermittent  . ADD (attention deficit disorder)   . BV (bacterial vaginosis)   . UTI (lower urinary tract infection)     not frequently  . Hypothyroidism   . Obesity    ROS as in subjective   Objective: BP 120/72 mmHg  Pulse 68  Wt  267 lb 12.8 oz (121.473 kg)  LMP 10/09/2015  Gen: wd, wn, nad Pleasant answers questions appropriately  Assessment Encounter Diagnoses  Name Primary?  . ADD (attention deficit disorder) Yes  . Adjustment disorder with mixed anxiety and depressed mood   . Work-related stress      Depression screen [facility] 2/9 10/31/2015  Decreased Interest 3  Down, Depressed, Hopeless 1  PHQ - 2 Score 4  Altered sleeping 0  Tired, decreased energy 3  Change in appetite 3  Feeling bad or failure about yourself  1  Trouble concentrating 3  Moving slowly or fidgety/restless 3  Suicidal thoughts 0  PHQ-9 Score 17  Difficult doing work/chores Somewhat difficult    Adult ADHD-RS-IV with Adult Prompts questionnaire with Inattentive score of 26, and hyperactive-impulse score of 6.  Plan: Reviewed prior chart notes from 2011 with brief period of treatment with stimulant.   Restart trial of Adderall 1-2 times daily ,discussed proper use of medication, risks/benefits of medication, goals of therapy.  discussed time management, getting study skills help at school, balance in life.  F/u 71mo, sooner prn.   Airlie was seen today for consult.  Diagnoses and all orders for this visit:  ADD (attention deficit disorder)  Adjustment disorder with mixed anxiety and depressed mood  Work-related stress  Other orders -     amphetamine-dextroamphetamine (ADDERALL) 10 MG tablet; Take 1 tablet (10 mg total) by mouth 2 (two) times daily.   Spent > 30 minutes face to face with patient in discussion of symptoms, evaluation, plan and recommendations.

## 2015-11-04 ENCOUNTER — Telehealth: Payer: Self-pay | Admitting: Medical

## 2015-11-05 NOTE — Telephone Encounter (Signed)
Pharmacy called back & problem has been fixed & went thru for $10 & pt informed

## 2015-11-05 NOTE — Telephone Encounter (Signed)
P.A. Approved, called pharmacy & still wouldn't go thru says dx code issues.  S/s [name] & she is calling the help desk and will call me back.

## 2015-12-01 ENCOUNTER — Ambulatory Visit: Payer: 59 | Admitting: Medical

## 2015-12-02 ENCOUNTER — Encounter: Payer: Self-pay | Admitting: Medical

## 2015-12-02 ENCOUNTER — Ambulatory Visit (INDEPENDENT_AMBULATORY_CARE_PROVIDER_SITE_OTHER): Payer: 59 | Admitting: Medical

## 2015-12-02 VITALS — BP 108/78 | HR 72 | Wt 263.0 lb

## 2015-12-02 DIAGNOSIS — F988 Other specified behavioral and emotional disorders with onset usually occurring in childhood and adolescence: Secondary | ICD-10-CM

## 2015-12-02 DIAGNOSIS — E039 Hypothyroidism, unspecified: Secondary | ICD-10-CM | POA: Diagnosis not present

## 2015-12-02 DIAGNOSIS — D649 Anemia, unspecified: Secondary | ICD-10-CM

## 2015-12-02 DIAGNOSIS — Z139 Encounter for screening, unspecified: Secondary | ICD-10-CM

## 2015-12-02 DIAGNOSIS — E669 Obesity, unspecified: Secondary | ICD-10-CM | POA: Diagnosis not present

## 2015-12-02 DIAGNOSIS — F909 Attention-deficit hyperactivity disorder, unspecified type: Secondary | ICD-10-CM | POA: Diagnosis not present

## 2015-12-02 LAB — CBC WITH DIFFERENTIAL/PLATELET
BASOS PCT: 0 %
Basophils Absolute: 0 cells/uL (ref 0–200)
EOS PCT: 2 %
Eosinophils Absolute: 128 cells/uL (ref 15–500)
HCT: 33.8 % — ABNORMAL LOW (ref 35.0–45.0)
HEMOGLOBIN: 10.7 g/dL — AB (ref 11.7–15.5)
LYMPHS ABS: 1664 {cells}/uL (ref 850–3900)
Lymphocytes Relative: 26 %
MCH: 28.1 pg (ref 27.0–33.0)
MCHC: 31.7 g/dL — AB (ref 32.0–36.0)
MCV: 88.7 fL (ref 80.0–100.0)
MPV: 10.8 fL (ref 7.5–12.5)
Monocytes Absolute: 256 cells/uL (ref 200–950)
Monocytes Relative: 4 %
NEUTROS ABS: 4352 {cells}/uL (ref 1500–7800)
NEUTROS PCT: 68 %
Platelets: 388 10*3/uL (ref 140–400)
RBC: 3.81 MIL/uL (ref 3.80–5.10)
RDW: 14.5 % (ref 11.0–15.0)
WBC: 6.4 10*3/uL (ref 4.0–10.5)

## 2015-12-02 LAB — BASIC METABOLIC PANEL
BUN: 16 mg/dL (ref 7–25)
CALCIUM: 9.7 mg/dL (ref 8.6–10.2)
CO2: 24 mmol/L (ref 20–31)
Chloride: 104 mmol/L (ref 98–110)
Creat: 0.72 mg/dL (ref 0.50–1.10)
GLUCOSE: 81 mg/dL (ref 65–99)
Potassium: 4.6 mmol/L (ref 3.5–5.3)
SODIUM: 135 mmol/L (ref 135–146)

## 2015-12-02 LAB — IRON AND TIBC
%SAT: 13 % (ref 11–50)
Iron: 40 ug/dL (ref 40–190)
TIBC: 304 ug/dL (ref 250–450)
UIBC: 264 ug/dL (ref 125–400)

## 2015-12-02 LAB — T4, FREE: FREE T4: 1.1 ng/dL (ref 0.8–1.8)

## 2015-12-02 LAB — TSH: TSH: 3.37 m[IU]/L

## 2015-12-02 MED ORDER — AMPHETAMINE-DEXTROAMPHETAMINE 15 MG PO TABS: 15.0000 mg | ORAL_TABLET | Freq: Two times a day (BID) | ORAL | Status: DC

## 2015-12-02 NOTE — Progress Notes (Signed)
Subjective: Chief Complaint  Patient presents with  . Follow-up    on adderall. thinks she needs a stronger dosage because it is not really lasting as long as she would like. wants to discuss thyroid   Here for recheck on ADD.   I saw her 10/31/15 for same.   Since last visit started Adderall  BID.   Sees improvement with focus, able to organize thoughts better, not feeling as overwhelming.   Seems to not last quite as long as she would like.   Wants to try something to last a little longer.     From last visit, she was last was on medication 2013, had eventually run out of medication, didn't have insurance any more, so didn't bother with it at the time.   In general feels down at times feeling overwhelmed, but not down all the time.   No prior medication for mood.   Last medication was Adderall.  Has seen counseling before at prior school.   Main stressors currently is school, staying above water, lives at home with her son, 7yo.  Her parents help watch him when he gets out of school.   Exercise - not at the time.   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   Is in the library for several hours up to 5 hours often, but only feels like she gets 2 good hours due to focus and inattention problems.  Family history - on father's side, thinks possibly some with bipolar and depression.   First diagnosed by Dr. Susann Givens here 2012.  Currently school schedule is 3-7:45 M, W, clinicals on Tuesday most of the day, off Thursday, and works the whole weekend.   Here for recheck on anemia, thyroid, is taking iron and does feel better on iron.  No other aggravating or relieving factors. No other complaint.  Past Medical History  Diagnosis Date  . Eczema   . Allergy     RHINITIS  . Headache(784.0)     intermittent  . ADD (attention deficit disorder)   . BV  (bacterial vaginosis)   . UTI (lower urinary tract infection)     not frequently  . Hypothyroidism   . Obesity    ROS as in subjective   Objective: BP 108/78 mmHg  Pulse 72  Wt 263 lb (119.296 kg)  LMP 11/01/2015  Wt Readings from Last 3 Encounters:  12/02/15 263 lb (119.296 kg)  10/31/15 267 lb 12.8 oz (121.473 kg)  10/23/15 264 lb 12.8 oz (120.112 kg)   Gen: wd, wn, nad Pleasant answers questions appropriately Neck: supple, nontender, no mass, no thyromegaly Heart RRR, normal S1, S2, no murmurs Ext: no edema Pulses normal    Assessment Encounter Diagnoses  Name Primary?  . ADD (attention deficit disorder) Yes  . Hypothyroidism, unspecified hypothyroidism type   . Anemia, unspecified   . Obesity   . Screening for condition     Adult ADHD-RS-IV with Adult Prompts questionnaire with Inattentive score of 16, and hyperactive-impulse score of 0.  Plan: ADD - increase to  Adderall BID, c/t working on organization, time management, focus.      Repeat labs today for anemia, hypothyroidism   Obesity - work on efforts at [employer], exercise, weight loss  She will check home records for Tdap date and screen  for hep B immunity today.  Delice Bisonara was seen today for follow-up.  Diagnoses and all orders for this visit:  ADD (attention deficit disorder) -     Basic metabolic panel  Hypothyroidism, unspecified hypothyroidism type -     Basic metabolic panel -     TSH -     T4, free  Anemia, unspecified -     Basic metabolic panel -     CBC with Differential/Platelet -     Iron and TIBC  Obesity -     Basic metabolic panel  Screening for condition -     Hepatitis B surface antibody  Other orders -     amphetamine-dextroamphetamine (ADDERALL) 15 MG tablet; Take 1 tablet by mouth 2 (two) times daily.   Spent > 30 minutes face to face with patient in discussion of symptoms, evaluation, plan and recommendations.

## 2015-12-03 LAB — HEPATITIS B SURFACE ANTIBODY, QUANTITATIVE: Hepatitis B-Post: 0.5 m[IU]/mL

## 2016-01-14 ENCOUNTER — Encounter (HOSPITAL_BASED_OUTPATIENT_CLINIC_OR_DEPARTMENT_OTHER): Payer: Self-pay

## 2016-01-14 ENCOUNTER — Emergency Department (HOSPITAL_BASED_OUTPATIENT_CLINIC_OR_DEPARTMENT_OTHER)
Admission: EM | Admit: 2016-01-14 | Discharge: 2016-01-14 | Disposition: A | Discharge status: Home / Self Care | Payer: Managed Care, Other (non HMO) | Attending: Emergency Medicine | Admitting: Emergency Medicine

## 2016-01-14 DIAGNOSIS — M79622 Pain in left upper arm: Secondary | ICD-10-CM | POA: Insufficient documentation

## 2016-01-14 DIAGNOSIS — R519 Headache, unspecified: Secondary | ICD-10-CM

## 2016-01-14 DIAGNOSIS — Z6841 Body Mass Index (BMI) 40.0 and over, adult: Secondary | ICD-10-CM | POA: Insufficient documentation

## 2016-01-14 DIAGNOSIS — E669 Obesity, unspecified: Secondary | ICD-10-CM | POA: Insufficient documentation

## 2016-01-14 DIAGNOSIS — M79621 Pain in right upper arm: Secondary | ICD-10-CM | POA: Insufficient documentation

## 2016-01-14 DIAGNOSIS — R51 Headache: Secondary | ICD-10-CM | POA: Insufficient documentation

## 2016-01-14 DIAGNOSIS — E039 Hypothyroidism, unspecified: Secondary | ICD-10-CM | POA: Insufficient documentation

## 2016-01-14 MED ORDER — NAPROXEN 250 MG PO TABS
500.0000 mg | ORAL_TABLET | Freq: Once | ORAL | Status: AC
  Administered 2016-01-14: 500 mg via ORAL
  Filled 2016-01-14: qty 2

## 2016-01-14 NOTE — ED Provider Notes (Signed)
CSN: [id number]     Arrival date & time 01/14/16  0250 History   First MD Initiated Contact with Patient 01/14/16 0302     Chief Complaint  Patient presents with  . Headache     (Consider location/radiation/quality/duration/timing/severity/associated sxs/prior Treatment) HPI  This is a 30 year old female who was having pain in her biceps bilaterally beginning yesterday. The pain is now present only in the left bicep. She describes the pain is dull, constant and does not change with movement of her arms or neck. The pain does not change with palpation. She denies any injury or excessive use of her arms. She does not believe it to be musculoskeletal in nature. She is also complaining of a headache that awakened her from sleep this morning about 30 minutes ago. She describes it as a severe, sharp pain in her left forehead that has subsequently nearly resolved. She did not take anything for the pain this morning but she says "I think and may have taken Tylenol yesterday. I don't know for sure." She is concerned that she may have a circulation problem or an aneurysm.  Past Medical History  Diagnosis Date  . Eczema   . Allergy     RHINITIS  . Headache(784.0)     intermittent  . ADD (attention deficit disorder)   . BV (bacterial vaginosis)   . UTI (lower urinary tract infection)     not frequently  . Hypothyroidism   . Obesity    Past Surgical History  Procedure Laterality Date  . Wisdom tooth extraction     Family History  Problem Relation Age of Onset  . Hypertension Maternal Grandmother   . Heart disease Maternal Grandmother   . Kidney disease Maternal Grandmother   . Diabetes Maternal Grandmother   . Fibromyalgia Mother   . Lupus Mother   . Obesity Mother   . Deep vein thrombosis Father   . Hypertension Father   . Diabetes Father   . Obesity Father   . Asthma Sister   . Hypertension Maternal Grandfather   . Cancer Maternal Grandfather   . Alzheimer's disease Paternal  Grandmother   . Diabetes Paternal Grandfather   . Seizures Sister    Social History  Substance Use Topics  . Smoking status: Never Smoker   . Smokeless tobacco: Never Used  . Alcohol Use: Yes   OB History    No data available     Review of Systems  All other systems reviewed and are negative.   Allergies  Review of patient's allergies indicates no known allergies.  Home Medications   Prior to Admission medications   Medication Sig Start Date End Date Taking? Authorizing Provider  amphetamine-dextroamphetamine (ADDERALL) 15 MG tablet Take 1 tablet by mouth 2 (two) times daily. 12/02/15   Kermit Baloavid S Tysinger, PA-C  ferrous gluconate (FERGON) 324 MG tablet Take 1 tablet (324 mg total) by mouth 2 (two) times daily with a meal. 07/16/15   Kermit Baloavid S Tysinger, PA-C   BP 112/75 mmHg  Pulse 80  Temp(Src) 98.3 F (36.8 C) (Oral)  Resp 16  Ht 5\' 6"  (1.676 m)  Wt 260 lb (117.935 kg)  BMI 41.99 kg/m2  SpO2 100%  LMP 01/13/2016 (Exact Date)   Physical Exam  General: Well-developed, well-nourished female in no acute distress; appearance consistent with age of record HENT: normocephalic; atraumatic Eyes: pupils equal, round and reactive to light; extraocular muscles intact Neck: supple Heart: regular rate and rhythm Lungs: clear to auscultation bilaterally Abdomen: soft;  nondistended; nontender; no masses or hepatosplenomegaly; bowel sounds present Extremities: No deformity; full range of motion; pulses normal; no tenderness of upper arms bilaterally Neurologic: Awake, alert and oriented; motor function intact in all extremities and symmetric; no facial droop; normal coordination and speech Skin: Warm and dry Psychiatric: Normal mood and affect  ED Course  Procedures (including critical care time)   MDM  The cause of the patient's arm pain is not clear. Her headache may be a new onset cluster headache but she has not established a pattern make this diagnosis.     Paula Libra,  MD 01/14/16 [phone]

## 2016-01-14 NOTE — ED Notes (Signed)
Pt states that two days ago she had bilateral arm pain, had a headache yesterday with the arm pain, still c/o headache and only left arm pain, pt has not taken any medications to try to relieve symptoms.

## 2016-01-14 NOTE — ED Notes (Signed)
Pt verbalizes understanding of d/c instructions and denies any further needs at this time. 

## 2016-04-15 ENCOUNTER — Encounter: Payer: 59 | Admitting: Medical

## 2016-11-16 ENCOUNTER — Encounter (HOSPITAL_BASED_OUTPATIENT_CLINIC_OR_DEPARTMENT_OTHER): Payer: Self-pay | Admitting: *Deleted

## 2016-11-16 ENCOUNTER — Emergency Department (HOSPITAL_BASED_OUTPATIENT_CLINIC_OR_DEPARTMENT_OTHER)
Admission: EM | Admit: 2016-11-16 | Discharge: 2016-11-16 | Disposition: A | Discharge status: Home / Self Care | Payer: Managed Care, Other (non HMO) | Attending: Emergency Medicine | Admitting: Emergency Medicine

## 2016-11-16 DIAGNOSIS — N939 Abnormal uterine and vaginal bleeding, unspecified: Secondary | ICD-10-CM | POA: Insufficient documentation

## 2016-11-16 DIAGNOSIS — B379 Candidiasis, unspecified: Secondary | ICD-10-CM | POA: Insufficient documentation

## 2016-11-16 DIAGNOSIS — E039 Hypothyroidism, unspecified: Secondary | ICD-10-CM | POA: Insufficient documentation

## 2016-11-16 LAB — WET PREP, GENITAL
CLUE CELLS WET PREP: NONE SEEN
Sperm: NONE SEEN
TRICH WET PREP: NONE SEEN

## 2016-11-16 LAB — URINALYSIS, ROUTINE W REFLEX MICROSCOPIC
BILIRUBIN URINE: NEGATIVE
Glucose, UA: NEGATIVE mg/dL
KETONES UR: NEGATIVE mg/dL
NITRITE: NEGATIVE
PROTEIN: NEGATIVE mg/dL
Specific Gravity, Urine: 1.024 (ref 1.005–1.030)
pH: 7.5 (ref 5.0–8.0)

## 2016-11-16 LAB — URINALYSIS, MICROSCOPIC (REFLEX)

## 2016-11-16 LAB — PREGNANCY, URINE: PREG TEST UR: NEGATIVE

## 2016-11-16 MED ORDER — FLUCONAZOLE 50 MG PO TABS
150.0000 mg | ORAL_TABLET | Freq: Once | ORAL | Status: AC
  Administered 2016-11-16: 150 mg via ORAL
  Filled 2016-11-16: qty 1

## 2016-11-16 NOTE — ED Notes (Signed)
School note given

## 2016-11-16 NOTE — ED Triage Notes (Signed)
Pt reports she missed her period last month. Reports she began spotting yesterday and had some brown discharge which was not like her usual menstrual cycle. C/o cramps in right side. Also reports right shoulder pain

## 2016-11-16 NOTE — ED Provider Notes (Signed)
[facility] Note   CSN: [id number] Arrival date & time: 11/16/16  1035     History   Chief Complaint Chief Complaint  Patient presents with  . Abdominal Pain    HPI [name] is a 31 y.o. female.  The history is provided by the patient.  Abdominal Pain   This is a new problem. The current episode started yesterday. The problem occurs daily. The problem has not changed since onset.The pain is associated with an unknown factor. The pain is located in the RUQ. The pain is mild. Associated symptoms include arthralgias. Pertinent negatives include fever, vomiting and dysuria. Nothing aggravates the symptoms. Nothing relieves the symptoms.  patient reports missing period last month, and reports she started "spotting" yesterday with blood and discharge She also mentions RUQ cramping pain and some right shoulder pain of unknown etiology No fever/vomiting No dysuria No h/o ABD surgeries She reports she has not been exposed to STDs recently   Past Medical History:  Diagnosis Date  . ADD (attention deficit disorder)   . Allergy    RHINITIS  . BV (bacterial vaginosis)   . Eczema   . Headache(784.0)    intermittent  . Hypothyroidism   . Obesity   . UTI (lower urinary tract infection)    not frequently    Patient Active Problem List   Diagnosis Date Noted  . Anemia, unspecified 12/02/2015  . Thyroid activity decreased 12/02/2015  . ADD (attention deficit disorder) 12/02/2015  . Encounter for health maintenance examination in adult 07/16/2015  . Obesity 07/16/2015  . Family history of DVT 07/16/2015  . Family history of diabetes mellitus 07/16/2015  . Anemia, iron deficiency 07/16/2015    Past Surgical History:  Procedure Laterality Date  . WISDOM TOOTH EXTRACTION      OB History    No data available       Home Medications    Prior to Admission medications   Medication Sig Start Date End Date Taking? Authorizing Provider    amphetamine-dextroamphetamine (ADDERALL) 15 MG tablet Take 1 tablet by mouth 2 (two) times daily. 12/02/15   Kermit Balo Tysinger, PA-C  ferrous gluconate (FERGON) 324 MG tablet Take 1 tablet (324 mg total) by mouth 2 (two) times daily with a meal. 07/16/15   Jac Canavan, PA-C    Family History Family History  Problem Relation Age of Onset  . Fibromyalgia Mother   . Lupus Mother   . Obesity Mother   . Deep vein thrombosis Father   . Hypertension Father   . Diabetes Father   . Obesity Father   . Asthma Sister   . Seizures Sister   . Hypertension Maternal Grandmother   . Heart disease Maternal Grandmother   . Kidney disease Maternal Grandmother   . Diabetes Maternal Grandmother   . Hypertension Maternal Grandfather   . Cancer Maternal Grandfather   . Alzheimer's disease Paternal Grandmother   . Diabetes Paternal Grandfather     Social History Social History  Substance Use Topics  . Smoking status: Never Smoker  . Smokeless tobacco: Never Used  . Alcohol use Yes     Comment: 1-2 nights weekly     Allergies   Patient has no known allergies.   Review of Systems Review of Systems  Constitutional: Negative for fever.  Respiratory: Negative for cough.   Gastrointestinal: Positive for abdominal pain. Negative for vomiting.  Genitourinary: Positive for vaginal discharge. Negative for dysuria.  Musculoskeletal: Positive for arthralgias. Negative for  back pain.  All other systems reviewed and are negative.    Physical Exam Updated Vital Signs BP 132/65 (BP Location: Left Arm)   Pulse 84   Temp 98.7 F (37.1 C) (Oral)   Resp 18   Ht  (1.676 m)   Wt 117.9 kg   LMP 09/26/2016 (Approximate)   SpO2 100%   BMI 41.97 kg/m   Physical Exam CONSTITUTIONAL: Well developed/well nourished HEAD: Normocephalic/atraumatic ENMT: Mucous membranes moist NECK: supple no meningeal signs SPINE/BACK:entire spine nontender CV: S1/S2 noted, no murmurs/rubs/gallops  noted LUNGS: Lungs are clear to auscultation bilaterally, no apparent distress ABDOMEN: soft, nontender, no rebound or guarding, bowel sounds noted throughout abdomen GU:no cva tenderness, pelvic -  bloody discharge noted, no CMT,no external lesions noted, nurse Joss present for pelvic exam NEURO: Pt is awake/alert/appropriate, moves all extremitiesx4.  No facial droop.   EXTREMITIES: pulses normal/equal, full ROM, mild reproduction of pain in right scapula with movement of right UE, no deformity and no other tenderness noted SKIN: warm, color normal PSYCH: no abnormalities of mood noted, alert and oriented to situation   ED Treatments / Results  Labs (all labs ordered are listed, but only abnormal results are displayed) Labs Reviewed  WET PREP, GENITAL - Abnormal; Notable for the following:       Result Value   Yeast Wet Prep HPF POC PRESENT (*)    WBC, Wet Prep HPF POC MANY (*)    All other components within normal limits  URINALYSIS, ROUTINE W REFLEX MICROSCOPIC - Abnormal; Notable for the following:    APPearance CLOUDY (*)    Hgb urine dipstick LARGE (*)    Leukocytes, UA SMALL (*)    All other components within normal limits  URINALYSIS, MICROSCOPIC (REFLEX) - Abnormal; Notable for the following:    Bacteria, UA MANY (*)    Squamous Epithelial / LPF 0-5 (*)    All other components within normal limits  PREGNANCY, URINE  GC/CHLAMYDIA PROBE AMP ([place]) NOT AT [facility]    EKG  EKG Interpretation None       Radiology No results found.  Procedures Procedures (including critical care time)  Medications Ordered in ED Medications  fluconazole (DIFLUCAN) tablet 150 mg (150 mg Oral Given 11/16/16 1140)     Initial Impression / Assessment and Plan / ED Course  I have reviewed the triage vital signs and the nursing notes.  Pertinent labs   results that were available during my care of the patient were reviewed by me and considered in my medical decision making (see  chart for details).     Pt well appearing She is not pregnant Will treat for yeast infection Abdominal exam unremarkable  Final Clinical Impressions(s) / ED Diagnoses   Final diagnoses:  Yeast infection  Abnormal vaginal bleeding    New Prescriptions New Prescriptions   No medications on file     Tamara Rhine, MD 11/16/16 1143

## 2016-11-17 LAB — GC/CHLAMYDIA PROBE AMP (~~LOC~~) NOT AT ARMC
Chlamydia: NEGATIVE
Neisseria Gonorrhea: NEGATIVE

## 2018-01-09 ENCOUNTER — Other Ambulatory Visit: Payer: Self-pay

## 2018-01-09 ENCOUNTER — Encounter (HOSPITAL_BASED_OUTPATIENT_CLINIC_OR_DEPARTMENT_OTHER): Payer: Self-pay | Admitting: Emergency Medicine

## 2018-01-09 ENCOUNTER — Emergency Department (HOSPITAL_BASED_OUTPATIENT_CLINIC_OR_DEPARTMENT_OTHER)
Admission: EM | Admit: 2018-01-09 | Discharge: 2018-01-09 | Disposition: A | Discharge status: Home / Self Care | Payer: Managed Care, Other (non HMO) | Attending: Emergency Medicine | Admitting: Emergency Medicine

## 2018-01-09 DIAGNOSIS — J3489 Other specified disorders of nose and nasal sinuses: Secondary | ICD-10-CM | POA: Insufficient documentation

## 2018-01-09 DIAGNOSIS — R350 Frequency of micturition: Secondary | ICD-10-CM | POA: Insufficient documentation

## 2018-01-09 DIAGNOSIS — R067 Sneezing: Secondary | ICD-10-CM | POA: Insufficient documentation

## 2018-01-09 DIAGNOSIS — R059 Cough, unspecified: Secondary | ICD-10-CM

## 2018-01-09 DIAGNOSIS — E039 Hypothyroidism, unspecified: Secondary | ICD-10-CM | POA: Insufficient documentation

## 2018-01-09 DIAGNOSIS — R0981 Nasal congestion: Secondary | ICD-10-CM | POA: Insufficient documentation

## 2018-01-09 DIAGNOSIS — R6883 Chills (without fever): Secondary | ICD-10-CM | POA: Insufficient documentation

## 2018-01-09 DIAGNOSIS — R300 Dysuria: Secondary | ICD-10-CM | POA: Insufficient documentation

## 2018-01-09 DIAGNOSIS — R05 Cough: Secondary | ICD-10-CM | POA: Insufficient documentation

## 2018-01-09 DIAGNOSIS — R3915 Urgency of urination: Secondary | ICD-10-CM | POA: Insufficient documentation

## 2018-01-09 LAB — URINALYSIS, ROUTINE W REFLEX MICROSCOPIC
BILIRUBIN URINE: NEGATIVE
GLUCOSE, UA: NEGATIVE mg/dL
Ketones, ur: 15 mg/dL — AB
LEUKOCYTES UA: NEGATIVE
Nitrite: NEGATIVE
PROTEIN: NEGATIVE mg/dL
Specific Gravity, Urine: 1.025 (ref 1.005–1.030)
pH: 6 (ref 5.0–8.0)

## 2018-01-09 LAB — URINALYSIS, MICROSCOPIC (REFLEX)

## 2018-01-09 LAB — WET PREP, GENITAL
Clue Cells Wet Prep HPF POC: NONE SEEN
SPERM: NONE SEEN
TRICH WET PREP: NONE SEEN
Yeast Wet Prep HPF POC: NONE SEEN

## 2018-01-09 LAB — PREGNANCY, URINE: Preg Test, Ur: NEGATIVE

## 2018-01-09 LAB — CBG MONITORING, ED: GLUCOSE-CAPILLARY: 116 mg/dL — AB (ref 65–99)

## 2018-01-09 MED ORDER — BENZONATATE 100 MG PO CAPS: 100.0000 mg | ORAL_CAPSULE | Freq: Three times a day (TID) | ORAL | 0 refills | Status: DC

## 2018-01-09 MED ORDER — FLUTICASONE PROPIONATE 50 MCG/ACT NA SUSP: 1.0000 | Freq: Every day | NASAL | 0 refills | Status: DC

## 2018-01-09 MED FILL — BENZONATATE 100 MG CAPSULE: 100 | 7 days supply | Qty: 21 | Fill #0

## 2018-01-09 MED FILL — FLUTICASONE PROP 50 MCG SPR: 50 | 60 days supply | Qty: 16 | Fill #0

## 2018-01-09 NOTE — Discharge Instructions (Addendum)
You were seen in the emergency department today for cough and for urinary symptoms.  Your urine did not look like it had an obvious UTI (infection), there were some findings consistent with being dehydrated, there is a small amount of blood, please have this repeated by your primary care provider in 1 week.  Additionally we have sent her urine off for culture, if there appears to be an infection we will call you and get you treated with antibiotics.  We are unsure of exactly why you are having your urinary symptoms at this time which is why we would like you to follow-up with your primary care provider.  We will call you if any of your STD results return positive, if positive you will need to seek treatment and inform all sexual partners.  We suspect her cough is related to either a virus or allergies, we have given you 2 medications to help with this: -Flonase-as a nasal spray, use in each nostril daily - Tessalon-this is an anticough medicine, take this every 8 hours as needed for cough.  We have prescribed you new medication(s) today. Discuss the medications prescribed today with your pharmacist as they can have adverse effects and interactions with your other medicines including over the counter and prescribed medications. Seek medical evaluation if you start to experience new or abnormal symptoms after taking one of these medicines, seek care immediately if you start to experience difficulty breathing, feeling of your throat closing, facial swelling, or rash as these could be indications of a more serious allergic reaction   Please follow-up with your primary care provider and/or the women's health provider and your discharge instructions in 1 week for reevaluation.  Return to the ER for new or worsening symptoms including but not limited to fevers, inability to keep fluids down, trouble breathing, or any other concerns that you may have.

## 2018-01-09 NOTE — ED Triage Notes (Signed)
Pt having cough for one week, non-productive,  No known fever.  Some chills and runny nose initially but has subsided.  Some sore throat.

## 2018-01-09 NOTE — ED Provider Notes (Signed)
[facility] EMERGENCY DEPARTMENT Provider Note   CSN: [id number] Arrival date & time: 01/09/18  1005     History   Chief Complaint Chief Complaint  Patient presents with  . Cough  . Dysuria    HPI [name] is a 32 y.o. female with a hx of ADD, BV, hypothyroidism, and anemia who presents to the ED with complaints of URI sxs x 2 weeks and urinary sxs x 1 week. Patient states that she has had rhinorrhea, congestion, sneezing, itchy throat, chills, and dry cough. She states she has been taking OTC robitussin with some improvement- she states congestion/rhinorrhea and chills have resolved with medications, but cough has persisted and she wanted to have this checked out. No sick contacts with similar sxs. Denies tic exposures. Denies fever, dyspnea, or chest pain. She also mentions she has had some urinary frequency and urgency especially at night x 1 week. Denies dysuria, abdominal pain, vomiting, or vaginal discharge/bleeding. She is currently sexually active in a monogamous relationship, intermittent condom use, unsure of LMP states it has been "a long time."   HPI  Past Medical History:  Diagnosis Date  . ADD (attention deficit disorder)   . Allergy    RHINITIS  . BV (bacterial vaginosis)   . Eczema   . Headache(784.0)    intermittent  . Hypothyroidism   . Obesity   . UTI (lower urinary tract infection)    not frequently    Patient Active Problem List   Diagnosis Date Noted  . Anemia, unspecified 12/02/2015  . Thyroid activity decreased 12/02/2015  . ADD (attention deficit disorder) 12/02/2015  . Encounter for health maintenance examination in adult 07/16/2015  . Obesity 07/16/2015  . Family history of DVT 07/16/2015  . Family history of diabetes mellitus 07/16/2015  . Anemia, iron deficiency 07/16/2015    Past Surgical History:  Procedure Laterality Date  . WISDOM TOOTH EXTRACTION       OB History   None      Home Medications    Prior to  Admission medications   Not on File    Family History Family History  Problem Relation Age of Onset  . Fibromyalgia Mother   . Lupus Mother   . Obesity Mother   . Deep vein thrombosis Father   . Hypertension Father   . Diabetes Father   . Obesity Father   . Asthma Sister   . Seizures Sister   . Hypertension Maternal Grandmother   . Heart disease Maternal Grandmother   . Kidney disease Maternal Grandmother   . Diabetes Maternal Grandmother   . Hypertension Maternal Grandfather   . Cancer Maternal Grandfather   . Alzheimer's disease Paternal Grandmother   . Diabetes Paternal Grandfather     Social History Social History   Tobacco Use  . Smoking status: Never Smoker  . Smokeless tobacco: Never Used  Substance Use Topics  . Alcohol use: Yes    Comment: 1-2 nights weekly  . Drug use: No     Allergies   Patient has no known allergies.   Review of Systems Review of Systems  Constitutional: Positive for chills (resolved). Negative for fever.  HENT: Positive for congestion (resolved), rhinorrhea (resolved) and sore throat ("itchy" not necessarily pain). Negative for ear pain, trouble swallowing and voice change.   Respiratory: Positive for cough. Negative for shortness of breath.   Cardiovascular: Negative for chest pain.  Genitourinary: Positive for frequency and urgency. Negative for dysuria, hematuria, vaginal bleeding and vaginal  discharge.  All other systems reviewed and are negative.    Physical Exam Updated Vital Signs BP 100/70 (BP Location: Right Arm)   Pulse 86   Temp 98.4 F (36.9 C) (Oral)   Resp 18   Ht 5\' 6"  (1.676 m)   Wt 127 kg (280 lb)   LMP 06/11/2017   SpO2 99%   BMI 45.19 kg/m   Physical Exam  Constitutional: She appears well-developed and well-nourished.  Non-toxic appearance. No distress.  HENT:  Head: Normocephalic and atraumatic.  Right Ear: No mastoid tenderness. Tympanic membrane is not perforated, not erythematous, not  retracted and not bulging.  Left Ear: No mastoid tenderness. Tympanic membrane is not perforated, not erythematous, not retracted and not bulging.  Nose: Mucosal edema present. Right sinus exhibits no maxillary sinus tenderness and no frontal sinus tenderness. Left sinus exhibits no maxillary sinus tenderness and no frontal sinus tenderness.  Mouth/Throat: Uvula is midline and oropharynx is clear and moist. No oropharyngeal exudate or posterior oropharyngeal erythema.  Tolerating her own secretions without difficulty. No trismus. No hot potato voice. No drooling. Airway is patent.   Eyes: Pupils are equal, round, and reactive to light. Conjunctivae are normal. Right eye exhibits no discharge. Left eye exhibits no discharge.  Neck: Normal range of motion. Neck supple. No neck rigidity.  Cardiovascular: Normal rate and regular rhythm.  No murmur heard. Pulmonary/Chest: Effort normal and breath sounds normal. No respiratory distress. She has no wheezes. She has no rhonchi. She has no rales.  Abdominal: Soft. She exhibits no distension. There is no tenderness. There is no rigidity, no rebound and no guarding.  Genitourinary: Pelvic exam was performed with patient supine. There is no rash or tenderness on the right labia. There is no rash or tenderness on the left labia. Cervix exhibits no motion tenderness and no friability. Right adnexum displays no mass, no tenderness and no fullness. Left adnexum displays no mass, no tenderness and no fullness. No tenderness or bleeding in the vagina. Vaginal discharge (thin white) found.  Genitourinary Comments: EDT Kayla Present as Chaperone  Lymphadenopathy:    She has no cervical adenopathy.  Neurological: She is alert.  Skin: Skin is warm and dry. No rash noted.  Psychiatric: She has a normal mood and affect. Her behavior is normal.  Nursing note and vitals reviewed.    ED Treatments / Results  Labs Results for orders placed or performed during the  hospital encounter of 01/09/18  Wet prep, genital  Result Value Ref Range   Yeast Wet Prep HPF POC NONE SEEN NONE SEEN   Trich, Wet Prep NONE SEEN NONE SEEN   Clue Cells Wet Prep HPF POC NONE SEEN NONE SEEN   WBC, Wet Prep HPF POC MANY (A) NONE SEEN   Sperm NONE SEEN   Urinalysis, Routine w reflex microscopic  Result Value Ref Range   Color, Urine YELLOW YELLOW   APPearance CLEAR CLEAR   Specific Gravity, Urine 1.025 1.005 - 1.030   pH 6.0 5.0 - 8.0   Glucose, UA NEGATIVE NEGATIVE mg/dL   Hgb urine dipstick SMALL (A) NEGATIVE   Bilirubin Urine NEGATIVE NEGATIVE   Ketones, ur 15 (A) NEGATIVE mg/dL   Protein, ur NEGATIVE NEGATIVE mg/dL   Nitrite NEGATIVE NEGATIVE   Leukocytes, UA NEGATIVE NEGATIVE  Pregnancy, urine  Result Value Ref Range   Preg Test, Ur NEGATIVE NEGATIVE  Urinalysis, Microscopic (reflex)  Result Value Ref Range   RBC / HPF 6-10 0 - 5 RBC/hpf  WBC, UA 0-5 0 - 5 WBC/hpf   Bacteria, UA FEW (A) NONE SEEN   Squamous Epithelial / LPF 11-20 0 - 5  POC CBG, ED  Result Value Ref Range   Glucose-Capillary 116 (H) 65 - 99 mg/dL   No results found.  EKG None  Radiology No results found.  Procedures Procedures (including critical care time)  Medications Ordered in ED Medications - No data to display   Initial Impression / Assessment and Plan / ED Course  I have reviewed the triage vital signs and the nursing notes.  Pertinent labs & imaging results that were available during my care of the patient were reviewed by me and considered in my medical decision making (see chart for details).   Patient presents with complaints of URI sxs and urinary sxs. Patient nontoxic appearing, in no apparent distress, vitals WNL.   - Regarding URI sxs- suspect viral vs. allergic. Patient's lungs are CTA, no respiratory distress, afebrile, doubt pneumonia. No wheezing on exam. No sinus tenderness, afebrile, doubt acute bacterial sinusitis. Centor Score 0- doubt strep  pharyngitis. No tic exposures to raise concern for tic borne illness. Will treat supportively with Flonase and Tessalon.  - Regarding urinary sxs- UA does not appear consistent with obvious infection, given sxs will send for culture. Urine pregnancy test is negative. STD testing obtained including GC/chlamydia, HIV, and syphilis- all pending, offered GC/chlamydia prophylaxis, to which patient declined. CBG 116. Unclear definitive etiology at this time, remaining labs pending. Will have patient follow up with PCP.   I discussed results, treatment plan, need for PCP follow-up, and return precautions with the patient. Provided opportunity for questions, patient confirmed understanding and is in agreement with plan.    Final Clinical Impressions(s) / ED Diagnoses   Final diagnoses:  Cough  Urinary frequency    ED Discharge Orders        Ordered    benzonatate (TESSALON) 100 MG capsule  Every 8 hours     01/09/18 1210    fluticasone (FLONASE) 50 MCG/ACT nasal spray  Daily     01/09/18 1210       Cherly Andersonetrucelli, Jarrah Seher R, PA-C 01/09/18 1952    Mesner, Barbara CowerJason, MD 01/10/18 [phone]

## 2018-01-10 LAB — HIV ANTIBODY (ROUTINE TESTING W REFLEX): HIV Screen 4th Generation wRfx: NONREACTIVE

## 2018-01-10 LAB — GC/CHLAMYDIA PROBE AMP (~~LOC~~) NOT AT ARMC
Chlamydia: NEGATIVE
NEISSERIA GONORRHEA: NEGATIVE

## 2018-01-10 LAB — RPR: RPR Ser Ql: NONREACTIVE

## 2018-01-11 LAB — URINE CULTURE

## 2018-01-12 ENCOUNTER — Telehealth: Payer: Self-pay | Admitting: Emergency Medicine

## 2018-01-12 NOTE — Telephone Encounter (Signed)
Post ED Visit - Positive Culture Follow-up  Culture report reviewed by antimicrobial stewardship pharmacist:  []  Enzo BiNathan Batchelder, Pharm.D. []  Celedonio MiyamotoJeremy Frens, Pharm.D., BCPS AQ-ID []  Garvin FilaMike Maccia, Pharm.D., BCPS []  Georgina PillionElizabeth Martin, Pharm.D., BCPS []  WoodlochMinh Pham, [address] Rainbow BoulevardPharm.D., BCPS, AAHIVP []  Estella HuskMichelle Turner, Pharm.D., BCPS, AAHIVP []  Lysle Pearlachel Rumbarger, PharmD, BCPS []  Sherlynn CarbonAustin Lucas, PharmD []  Pollyann SamplesAndy Johnston, PharmD, BCPS [facility]gett PharmD  Positive urine culture Treated with none, organism sensitive to the same and no further patient follow-up is required at this time.  Berle MullMiller, Onie Kasparek 01/12/2018, 11:34 AM  Post ED Visit - Positive Culture Follow-up  Culture report reviewed by antimicrobial stewardship pharmacist:  []  Enzo BiNathan Batchelder, Pharm.D. []  Celedonio MiyamotoJeremy Frens, Pharm.D., BCPS AQ-ID []  Garvin FilaMike Maccia, Pharm.D., BCPS []  Georgina PillionElizabeth Martin, Pharm.D., BCPS []  Port ChesterMinh Pham, [address]Pharm.D., BCPS, AAHIVP []  Estella HuskMichelle Turner, Pharm.D., BCPS, AAHIVP []  Lysle Pearlachel Rumbarger, PharmD, BCPS []  Sherlynn CarbonAustin Lucas, PharmD []  Pollyann SamplesAndy Johnston, PharmD, BCPS  Positive urine culture Treated with none,  Asymptomatic, organism sensitive to the same and no further patient follow-up is required at this time.  Berle MullMiller, Kynzleigh Bandel 01/12/2018, 11:34 AM

## 2020-04-21 ENCOUNTER — Other Ambulatory Visit: Payer: Self-pay

## 2020-04-21 ENCOUNTER — Encounter (HOSPITAL_BASED_OUTPATIENT_CLINIC_OR_DEPARTMENT_OTHER): Payer: Self-pay

## 2020-04-21 ENCOUNTER — Inpatient Hospital Stay (HOSPITAL_BASED_OUTPATIENT_CLINIC_OR_DEPARTMENT_OTHER)
Admission: EM | Admit: 2020-04-21 | Discharge: 2020-04-21 | Disposition: A | Discharge status: Home / Self Care | Payer: Self-pay | Attending: Obstetrics & Gynecology | Admitting: Obstetrics & Gynecology

## 2020-04-21 ENCOUNTER — Inpatient Hospital Stay (HOSPITAL_COMMUNITY): Payer: Self-pay

## 2020-04-21 DIAGNOSIS — Z3A01 Less than 8 weeks gestation of pregnancy: Secondary | ICD-10-CM | POA: Insufficient documentation

## 2020-04-21 DIAGNOSIS — F988 Other specified behavioral and emotional disorders with onset usually occurring in childhood and adolescence: Secondary | ICD-10-CM | POA: Insufficient documentation

## 2020-04-21 DIAGNOSIS — O99281 Endocrine, nutritional and metabolic diseases complicating pregnancy, first trimester: Secondary | ICD-10-CM | POA: Insufficient documentation

## 2020-04-21 DIAGNOSIS — Z6790 Unspecified blood type, Rh positive: Secondary | ICD-10-CM

## 2020-04-21 DIAGNOSIS — Z3490 Encounter for supervision of normal pregnancy, unspecified, unspecified trimester: Secondary | ICD-10-CM

## 2020-04-21 DIAGNOSIS — Z3A08 8 weeks gestation of pregnancy: Secondary | ICD-10-CM

## 2020-04-21 DIAGNOSIS — O99341 Other mental disorders complicating pregnancy, first trimester: Secondary | ICD-10-CM | POA: Insufficient documentation

## 2020-04-21 DIAGNOSIS — E039 Hypothyroidism, unspecified: Secondary | ICD-10-CM | POA: Insufficient documentation

## 2020-04-21 DIAGNOSIS — O200 Threatened abortion: Secondary | ICD-10-CM | POA: Insufficient documentation

## 2020-04-21 DIAGNOSIS — R109 Unspecified abdominal pain: Secondary | ICD-10-CM

## 2020-04-21 DIAGNOSIS — O209 Hemorrhage in early pregnancy, unspecified: Secondary | ICD-10-CM

## 2020-04-21 LAB — URINALYSIS, ROUTINE W REFLEX MICROSCOPIC
Bilirubin Urine: NEGATIVE
Glucose, UA: NEGATIVE mg/dL
Ketones, ur: NEGATIVE mg/dL
Leukocytes,Ua: NEGATIVE
Nitrite: NEGATIVE
Protein, ur: NEGATIVE mg/dL
Specific Gravity, Urine: 1.02 (ref 1.005–1.030)
pH: 6.5 (ref 5.0–8.0)

## 2020-04-21 LAB — CBC
HCT: 35.4 % — ABNORMAL LOW (ref 36.0–46.0)
Hemoglobin: 10.9 g/dL — ABNORMAL LOW (ref 12.0–15.0)
MCH: 28.3 pg (ref 26.0–34.0)
MCHC: 30.8 g/dL (ref 30.0–36.0)
MCV: 91.9 fL (ref 80.0–100.0)
Platelets: 359 10*3/uL (ref 150–400)
RBC: 3.85 MIL/uL — ABNORMAL LOW (ref 3.87–5.11)
RDW: 13.6 % (ref 11.5–15.5)
WBC: 6.9 10*3/uL (ref 4.0–10.5)
nRBC: 0 % (ref 0.0–0.2)

## 2020-04-21 LAB — BASIC METABOLIC PANEL
Anion gap: 8 (ref 5–15)
BUN: 14 mg/dL (ref 6–20)
CO2: 25 mmol/L (ref 22–32)
Calcium: 9.6 mg/dL (ref 8.9–10.3)
Chloride: 102 mmol/L (ref 98–111)
Creatinine, Ser: 0.77 mg/dL (ref 0.44–1.00)
GFR calc Af Amer: >60 mL/min (ref >60)
GFR calc non Af Amer: >60 mL/min (ref >60)
Glucose, Bld: 103 mg/dL — ABNORMAL HIGH (ref 70–99)
Potassium: 3.5 mmol/L (ref 3.5–5.1)
Sodium: 135 mmol/L (ref 135–145)

## 2020-04-21 LAB — WET PREP, GENITAL
Clue Cells Wet Prep HPF POC: NONE SEEN
Sperm: NONE SEEN
Trich, Wet Prep: NONE SEEN
Yeast Wet Prep HPF POC: NONE SEEN

## 2020-04-21 LAB — URINALYSIS, MICROSCOPIC (REFLEX)

## 2020-04-21 LAB — PREGNANCY, URINE: Preg Test, Ur: POSITIVE — AB

## 2020-04-21 LAB — HCG, QUANTITATIVE, PREGNANCY: hCG, Beta Chain, Quant, S: 27675 m[IU]/mL — ABNORMAL HIGH (ref <5)

## 2020-04-21 MED ORDER — PRENATAL PLUS 27-1 MG PO TABS: 1.0000 | ORAL_TABLET | Freq: Every day | ORAL | 0 refills | Status: DC

## 2020-04-21 NOTE — Discharge Instructions (Addendum)
Abdominal Pain During Pregnancy  Abdominal pain is common during pregnancy, and has many possible causes. Some causes are more serious than others, and sometimes the cause is not known. Abdominal pain can be a sign that labor is starting. It can also be caused by normal growth and stretching of muscles and ligaments during pregnancy. Always tell your health care provider if you have any abdominal pain. Follow these instructions at home:  Do not have sex or put anything in your vagina until your pain goes away completely.  Get plenty of rest until your pain improves.  Drink enough fluid to keep your urine pale yellow.  Take over-the-counter and prescription medicines only as told by your health care provider.  Keep all follow-up visits as told by your health care provider. This is important. Contact a health care provider if:  Your pain continues or gets worse after resting.  You have lower abdominal pain that: ? Comes and goes at regular intervals. ? Spreads to your back. ? Is similar to menstrual cramps.  You have pain or burning when you urinate. Get help right away if:  You have a fever or chills.  You have vaginal bleeding.  You are leaking fluid from your vagina.  You are passing tissue from your vagina.  You have vomiting or diarrhea that lasts for more than 24 hours.  Your baby is moving less than usual.  You feel very weak or faint.  You have shortness of breath.  You develop severe pain in your upper abdomen. Summary  Abdominal pain is common during pregnancy, and has many possible causes.  If you experience abdominal pain during pregnancy, tell your health care provider right away.  Follow your health care provider's home care instructions and keep all follow-up visits as directed. This information is not intended to replace advice given to you by your health care provider. Make sure you discuss any questions you have with your health care  provider. Document Revised: 11/06/2018 Document Reviewed: 10/21/2016 Elsevier Patient Education  2020 Elsevier Inc.  

## 2020-04-21 NOTE — ED Provider Notes (Signed)
[facility] EMERGENCY DEPARTMENT Provider Note   CSN: [id number] Arrival date & time: 04/21/20  1525     History Chief Complaint  Patient presents with  . Vaginal Bleeding    [name] is a 34 y.o. female G2, P1, presenting to the emergency department with complaints of 2 days of vaginal bleeding.  Patient states she thinks she started her period a couple of days ago though it was heavier than usual.  She states the bleeding improved though today she stood up and felt leading down her leg.  She went to sit on the toilet and asked a rather large blood clot.  She provides a picture on her phone which appears to be blood clot.  She reports some mild lower abdominal pains though nothing significant.  No fevers.  She reports having unprotected intercourse 3 weeks ago on September 3.  The following day she took a Plan B treatment.  She also reports being sexually active about 1 month ago.  She has 1 living child, she had a pregnancy at an early age and had an abortion.  The history is provided by the patient.       Past Medical History:  Diagnosis Date  . ADD (attention deficit disorder)   . Allergy    RHINITIS  . BV (bacterial vaginosis)   . Eczema   . Headache(784.0)    intermittent  . Hypothyroidism   . Obesity   . UTI (lower urinary tract infection)    not frequently    Patient Active Problem List   Diagnosis Date Noted  . Anemia, unspecified 12/02/2015  . Thyroid activity decreased 12/02/2015  . ADD (attention deficit disorder) 12/02/2015  . Encounter for health maintenance examination in adult 07/16/2015  . Obesity 07/16/2015  . Family history of DVT 07/16/2015  . Family history of diabetes mellitus 07/16/2015  . Anemia, iron deficiency 07/16/2015    Past Surgical History:  Procedure Laterality Date  . WISDOM TOOTH EXTRACTION       OB History   No obstetric history on file.     Family History  Problem Relation Age of Onset  . Fibromyalgia Mother     . Lupus Mother   . Obesity Mother   . Deep vein thrombosis Father   . Hypertension Father   . Diabetes Father   . Obesity Father   . Asthma Sister   . Seizures Sister   . Hypertension Maternal Grandmother   . Heart disease Maternal Grandmother   . Kidney disease Maternal Grandmother   . Diabetes Maternal Grandmother   . Hypertension Maternal Grandfather   . Cancer Maternal Grandfather   . Alzheimer's disease Paternal Grandmother   . Diabetes Paternal Grandfather     Social History   Tobacco Use  . Smoking status: Never Smoker  . Smokeless tobacco: Never Used  Substance Use Topics  . Alcohol use: Yes    Comment: 1-2 nights weekly  . Drug use: No    Home Medications Prior to Admission medications   Medication Sig Start Date End Date Taking? Authorizing Provider  benzonatate (TESSALON) 100 MG capsule Take 1 capsule (100 mg total) by mouth every 8 (eight) hours. 01/09/18   Petrucelli, Samantha R, PA-C  fluticasone (FLONASE) 50 MCG/ACT nasal spray Place 1 spray into both nostrils daily. 01/09/18   Petrucelli, Pleas Koch, PA-C    Allergies    Patient has no known allergies.  Review of Systems   Review of Systems  Genitourinary: Positive for  vaginal bleeding.  All other systems reviewed and are negative.   Physical Exam Updated Vital Signs BP 119/81   Pulse 88   Temp 98.5 F (36.9 C) (Oral)   Resp 15   Ht 5\' 5"  (1.651 m)   Wt 124.7 kg   LMP 04/18/2020   SpO2 100%   BMI 45.76 kg/m   Physical Exam Vitals and nursing note reviewed.  Constitutional:      General: She is not in acute distress.    Appearance: She is well-developed. She is not ill-appearing.  HENT:     Head: Normocephalic and atraumatic.  Eyes:     Conjunctiva/sclera: Conjunctivae normal.  Cardiovascular:     Rate and Rhythm: Normal rate and regular rhythm.  Pulmonary:     Effort: Pulmonary effort is normal. No respiratory distress.     Breath sounds: Normal breath sounds.  Abdominal:      General: Bowel sounds are normal.     Palpations: Abdomen is soft.     Tenderness: There is no abdominal tenderness.  Genitourinary:    Comments: Pelvic exam performed with female RN chaperone present.  There is scant amount of brown blood present in the vagina.  The cervix is difficult to visualize fully due to vaginal tissues obscuring view-  though there appears to be some dark red blood at the os.  bimanual exam without tenderness. No active hemorrhage noted Skin:    General: Skin is warm.  Neurological:     Mental Status: She is alert.  Psychiatric:        Behavior: Behavior normal.     ED Results / Procedures / Treatments   Labs (all labs ordered are listed, but only abnormal results are displayed) Labs Reviewed  WET PREP, GENITAL - Abnormal; Notable for the following components:      Result Value   WBC, Wet Prep HPF POC MANY (*)    All other components within normal limits  URINALYSIS, ROUTINE W REFLEX MICROSCOPIC - Abnormal; Notable for the following components:   Hgb urine dipstick LARGE (*)    All other components within normal limits  CBC - Abnormal; Notable for the following components:   RBC 3.85 (*)    Hemoglobin 10.9 (*)    HCT 35.4 (*)    All other components within normal limits  BASIC METABOLIC PANEL - Abnormal; Notable for the following components:   Glucose, Bld 103 (*)    All other components within normal limits  PREGNANCY, URINE - Abnormal; Notable for the following components:   Preg Test, Ur POSITIVE (*)    All other components within normal limits  URINALYSIS, MICROSCOPIC (REFLEX) - Abnormal; Notable for the following components:   Bacteria, UA MANY (*)    All other components within normal limits  HCG, QUANTITATIVE, PREGNANCY - Abnormal; Notable for the following components:   hCG, Beta Chain, Quant, S 27,675 (*)    All other components within normal limits  GC/CHLAMYDIA PROBE AMP ([place]) NOT AT [facility]    EKG None  Radiology No results  found.  Procedures Procedures (including critical care time)  Medications Ordered in ED Medications - No data to display  ED Course  I have reviewed the triage vital signs and the nursing notes.  Pertinent labs & imaging results that were available during my care of the patient were reviewed by me and considered in my medical decision making (see chart for details).  Clinical Course as of Apr 21 1926  Mon Apr 21, 2020  1903 Consulted with OB Dr. Despina Hidden -- recommending transfer to MAU. Pt stable to transfer by private vehicle- her sister will drive her.   [JR]    Clinical Course User Index [JR] Edgardo Petrenko, [place] N, PA-C   MDM Rules/Calculators/A&P                          Patient presenting with 3 days of vaginal bleeding, heavy at times.  She has some large clots today and provides a picture on her phone.  There does not appear to be products of conception in the photo, it appears to be a blood clot.  Pregnancy test is positive, quantitative hCG is 27,675.  Pelvic exam without active hemorrhage, no obvious products at the cervical os though there is some dark red blood. Visualization was limited.  She is not tender on exam.  Hemodynamically stable.  Ultrasound services are not currently available in this ED at this time of night.  Consulted with OB specialist Dr. Despina Hidden who recommends transfer to MAU for ultrasound.  Patient is stable for transfer by private vehicle -her sister will drive her.  She is instructed to report directly to [facility] to the MAU.  She verbalized understanding agrees with care plan at this time.  Discussed with Dr. Fredderick Phenix, agrees with work-up and care plan at this time.  Final Clinical Impression(s) / ED Diagnoses Final diagnoses:  Threatened miscarriage    Rx / DC Orders ED Discharge Orders    None       Maxene Byington, [place] N, PA-C 04/21/20 1927    Rolan Bucco, MD 04/21/20 [phone]

## 2020-04-21 NOTE — MAU Note (Signed)
Pt reports at 1330 today she had a gush of blood and a large clot. Went to [facility] and they told her she was pregnant. Mild cramping . Now she is spotting but no more clots.

## 2020-04-21 NOTE — MAU Provider Note (Signed)
History     CSN: [id number]  Arrival date and time: 04/21/20 1525   First Provider Initiated Contact with Patient 04/21/20 2140      Chief Complaint  Patient presents with  . Vaginal Bleeding   HPI  [name] is a 34 y.o. G1P0 in early pregnancy with chief complaint of abdominal cramping and vaginal bleeding. She arrived by private vehicle from [facility] ED. She endorses passing one large clot around 1330 today with scant spotting since that time. She also endorses mild suprapubic cramping. On arrival to MAU she rates her pain as 3/10. She has not taken medication or tried other treatments for these complaints. She denies abdominal tenderness, dysuria, fever. She is remote from sexual intercourse.  OB History    Gravida  1   Para      Term      Preterm      AB      Living        SAB      TAB      Ectopic      Multiple      Live Births              Past Medical History:  Diagnosis Date  . ADD (attention deficit disorder)   . Allergy    RHINITIS  . BV (bacterial vaginosis)   . Eczema   . Headache(784.0)    intermittent  . Hypothyroidism   . Obesity   . UTI (lower urinary tract infection)    not frequently    Past Surgical History:  Procedure Laterality Date  . WISDOM TOOTH EXTRACTION      Family History  Problem Relation Age of Onset  . Fibromyalgia Mother   . Lupus Mother   . Obesity Mother   . Deep vein thrombosis Father   . Hypertension Father   . Diabetes Father   . Obesity Father   . Asthma Sister   . Seizures Sister   . Hypertension Maternal Grandmother   . Heart disease Maternal Grandmother   . Kidney disease Maternal Grandmother   . Diabetes Maternal Grandmother   . Hypertension Maternal Grandfather   . Cancer Maternal Grandfather   . Alzheimer's disease Paternal Grandmother   . Diabetes Paternal Grandfather     Social History   Tobacco Use  . Smoking status: Never Smoker  . Smokeless tobacco: Never Used  Substance  Use Topics  . Alcohol use: Yes    Comment: 1-2 nights weekly  . Drug use: No    Allergies: No Known Allergies  No medications prior to admission.    Review of Systems  Gastrointestinal: Positive for abdominal pain.  Genitourinary: Positive for vaginal bleeding.  All other systems reviewed and are negative.  Physical Exam   Blood pressure 132/82, pulse 84, temperature 98.7 F (37.1 C), temperature source Oral, resp. rate 17, height 5\' 5"  (1.651 m), weight 124.7 kg, last menstrual period 04/18/2020, SpO2 100 %.  Physical Exam Vitals and nursing note reviewed. Exam conducted with a chaperone present.  Constitutional:      Appearance: Normal appearance.  Cardiovascular:     Rate and Rhythm: Normal rate.     Pulses: Normal pulses.  Pulmonary:     Effort: Pulmonary effort is normal.  Skin:    General: Skin is warm and dry.     Capillary Refill: Capillary refill takes less than 2 seconds.  Neurological:     General: No focal deficit present.  Mental Status: She is alert.  Psychiatric:        Mood and Affect: Mood normal.     MAU Course  Procedures  --S/p pelvic exam two hours ago at [facility]. Not reaccomplished in MAU  Patient Vitals for the past 24 hrs:  BP Temp Temp src Pulse Resp SpO2 Height Weight  04/21/20 2054 132/82 98.7 F (37.1 C) Oral 84 17 100 % -- --  04/21/20 1819 119/81 -- -- 88 15 100 % -- --  04/21/20 1546 -- -- -- -- -- -- 5\' 5"  (1.651 m) 124.7 kg  04/21/20 1545 (!) 150/100 98.5 F (36.9 C) Oral 95 17 100 % -- --   Results for orders placed or performed during the hospital encounter of 04/21/20 (from the past 24 hour(s))  Urinalysis, Routine w reflex microscopic Urine, Clean Catch     Status: Abnormal   Collection Time: 04/21/20  3:50 PM  Result Value Ref Range   Color, Urine YELLOW YELLOW   APPearance CLEAR CLEAR   Specific Gravity, Urine 1.020 1.005 - 1.030   pH 6.5 5.0 - 8.0   Glucose, UA NEGATIVE NEGATIVE mg/dL   Hgb urine dipstick LARGE  (A) NEGATIVE   Bilirubin Urine NEGATIVE NEGATIVE   Ketones, ur NEGATIVE NEGATIVE mg/dL   Protein, ur NEGATIVE NEGATIVE mg/dL   Nitrite NEGATIVE NEGATIVE   Leukocytes,Ua NEGATIVE NEGATIVE  CBC     Status: Abnormal   Collection Time: 04/21/20  3:50 PM  Result Value Ref Range   WBC 6.9 4.0 - 10.5 K/uL   RBC 3.85 (L) 3.87 - 5.11 MIL/uL   Hemoglobin 10.9 (L) 12.0 - 15.0 g/dL   HCT 04/23/20 (L) 36 - 46 %   MCV 91.9 80.0 - 100.0 fL   MCH 28.3 26.0 - 34.0 pg   MCHC 30.8 30.0 - 36.0 g/dL   RDW 76.2 83.1 - 51.7 %   Platelets 359 150 - 400 K/uL   nRBC 0.0 0.0 - 0.2 %  Basic metabolic panel     Status: Abnormal   Collection Time: 04/21/20  3:50 PM  Result Value Ref Range   Sodium 135 135 - 145 mmol/L   Potassium 3.5 3.5 - 5.1 mmol/L   Chloride 102 98 - 111 mmol/L   CO2 25 22 - 32 mmol/L   Glucose, Bld 103 (H) 70 - 99 mg/dL   BUN 14 6 - 20 mg/dL   Creatinine, Ser 04/23/20 0.44 - 1.00 mg/dL   Calcium 9.6 8.9 - 0.73 mg/dL   GFR calc non Af Amer >60 >60 mL/min   GFR calc Af Amer >60 >60 mL/min   Anion gap 8 5 - 15  Pregnancy, urine     Status: Abnormal   Collection Time: 04/21/20  3:50 PM  Result Value Ref Range   Preg Test, Ur POSITIVE (A) NEGATIVE  Urinalysis, Microscopic (reflex)     Status: Abnormal   Collection Time: 04/21/20  3:50 PM  Result Value Ref Range   RBC / HPF 0-5 0 - 5 RBC/hpf   WBC, UA 0-5 0 - 5 WBC/hpf   Bacteria, UA MANY (A) NONE SEEN   Squamous Epithelial / LPF 6-10 0 - 5  hCG, quantitative, pregnancy     Status: Abnormal   Collection Time: 04/21/20  3:50 PM  Result Value Ref Range   hCG, Beta Chain, Quant, S 27,675 (H) <5 mIU/mL  Wet prep, genital     Status: Abnormal   Collection Time: 04/21/20  7:05 PM  Result Value Ref Range   Yeast Wet Prep HPF POC NONE SEEN NONE SEEN   Trich, Wet Prep NONE SEEN NONE SEEN   Clue Cells Wet Prep HPF POC NONE SEEN NONE SEEN   WBC, Wet Prep HPF POC MANY (A) NONE SEEN   Sperm NONE SEEN    US OB LESS THAN 14 WEEKS WITH OB  TRANSVAGINAL  Result Date: 04/21/2020 CLINICAL DATA:  Bleeding EXAM: OBSTETRIC <14 WK [place] AND TRANSVAGINAL OB US TECHNIQUE: Both transabdominal and transvaginal ultrasound examinations were performed for complete evaluation of the gestation as well as the maternal uterus, adnexal regions, and pelvic cul-de-sac. Transvaginal technique was performed to assess early pregnancy. COMPARISON:  None. FINDINGS: Intrauterine gestational sac: Single Yolk sac:  Visualized. Embryo:  Visualized. Cardiac Activity: Visualized. Heart Rate: 96 bpm CRL:  2.3 mm   5 w   5 d                  [place] EDC: 12/17/2020 Subchorionic hemorrhage:  None visualized. Maternal uterus/adnexae: None IMPRESSION: Single live IUP at 5 weeks and 5 days. The fetal heart rate is bradycardic. Electronically Signed   By: Katherine Mantle M.D.   On: 04/21/2020 21:34   Meds ordered this encounter  Medications  . prenatal vitamin w/FE, FA (PRENATAL 1 + 1) 27-1 MG TABS tablet    Sig: Take 1 tablet by mouth daily at 12 noon.    Dispense:  30 tablet    Refill:  0    Order Specific Question:   Supervising Provider    Answer:   Duane Lope H [[id number]]   Assessment and Plan  --34 y.o. G1P0 with SIUP at 5w 5d --RH positive per patient. Declines additional blood draw in MAU --Discharge home in stable condition  Calvert Cantor, CNM 04/21/2020, 10:25 PM

## 2020-04-21 NOTE — ED Triage Notes (Signed)
Started menstrual cycle 2-3 days ago, this afternoon felt "gushing sensation" and had a large flow of blood with clots. Anemic & concerned for amount of blood loss. NAD.

## 2020-04-22 LAB — GC/CHLAMYDIA PROBE AMP (~~LOC~~) NOT AT ARMC
Chlamydia: NEGATIVE
Comment: NEGATIVE
Comment: NORMAL
Neisseria Gonorrhea: NEGATIVE

## 2020-05-19 ENCOUNTER — Ambulatory Visit (INDEPENDENT_AMBULATORY_CARE_PROVIDER_SITE_OTHER): Payer: Self-pay | Admitting: *Deleted

## 2020-05-19 ENCOUNTER — Inpatient Hospital Stay (HOSPITAL_COMMUNITY): Payer: Self-pay

## 2020-05-19 ENCOUNTER — Encounter: Payer: Self-pay | Admitting: General Practice

## 2020-05-19 ENCOUNTER — Encounter (HOSPITAL_COMMUNITY): Payer: Self-pay | Admitting: Obstetrics & Gynecology

## 2020-05-19 ENCOUNTER — Inpatient Hospital Stay (HOSPITAL_COMMUNITY)
Admission: AD | Admit: 2020-05-19 | Discharge: 2020-05-19 | Disposition: A | Discharge status: Home / Self Care | Payer: Self-pay | Attending: Family Medicine | Admitting: Family Medicine

## 2020-05-19 ENCOUNTER — Other Ambulatory Visit: Payer: Self-pay

## 2020-05-19 VITALS — Ht 65.5 in

## 2020-05-19 DIAGNOSIS — E039 Hypothyroidism, unspecified: Secondary | ICD-10-CM | POA: Insufficient documentation

## 2020-05-19 DIAGNOSIS — O468X1 Other antepartum hemorrhage, first trimester: Secondary | ICD-10-CM

## 2020-05-19 DIAGNOSIS — Z8349 Family history of other endocrine, nutritional and metabolic diseases: Secondary | ICD-10-CM | POA: Insufficient documentation

## 2020-05-19 DIAGNOSIS — Z79890 Hormone replacement therapy (postmenopausal): Secondary | ICD-10-CM | POA: Insufficient documentation

## 2020-05-19 DIAGNOSIS — N939 Abnormal uterine and vaginal bleeding, unspecified: Secondary | ICD-10-CM

## 2020-05-19 DIAGNOSIS — O418X10 Other specified disorders of amniotic fluid and membranes, first trimester, not applicable or unspecified: Secondary | ICD-10-CM

## 2020-05-19 DIAGNOSIS — Z3480 Encounter for supervision of other normal pregnancy, unspecified trimester: Secondary | ICD-10-CM | POA: Insufficient documentation

## 2020-05-19 DIAGNOSIS — O99281 Endocrine, nutritional and metabolic diseases complicating pregnancy, first trimester: Secondary | ICD-10-CM | POA: Insufficient documentation

## 2020-05-19 DIAGNOSIS — O208 Other hemorrhage in early pregnancy: Secondary | ICD-10-CM | POA: Insufficient documentation

## 2020-05-19 DIAGNOSIS — Z3A10 10 weeks gestation of pregnancy: Secondary | ICD-10-CM

## 2020-05-19 LAB — TYPE AND SCREEN
ABO/RH(D): A POS
Antibody Screen: NEGATIVE

## 2020-05-19 LAB — URINALYSIS, ROUTINE W REFLEX MICROSCOPIC
Bilirubin Urine: NEGATIVE
Glucose, UA: NEGATIVE mg/dL
Ketones, ur: NEGATIVE mg/dL
Leukocytes,Ua: NEGATIVE
Nitrite: NEGATIVE
Protein, ur: 30 mg/dL — AB
RBC / HPF: >50 RBC/hpf — ABNORMAL HIGH (ref 0–5)
Specific Gravity, Urine: 1.021 (ref 1.005–1.030)
pH: 6 (ref 5.0–8.0)

## 2020-05-19 MED ORDER — PRENATAL PLUS 27-1 MG PO TABS: 1.0000 | ORAL_TABLET | Freq: Every day | ORAL | 0 refills | Status: DC

## 2020-05-19 NOTE — MAU Note (Signed)
.   [name] is a 34 y.o. at [redacted]w[redacted]d here in MAU reporting: she started having vaginal bleeding this morning. Was evaluated 4-5 weeks ago for vaginal bleeding and U/S showed viable fetus. Lower abdominal  LMP: 04/18/20 Onset of complaint: today Pain score: 3 Vitals:   05/19/20 1238  BP: 103/78  Pulse: 100  Resp: 16  Temp: 98.3 F (36.8 C)  SpO2: 100%     FHT: Lab orders placed from triage: UA

## 2020-05-19 NOTE — Progress Notes (Signed)
  Virtual Visit via Telephone Note  I connected with Billie Ruddy on 05/19/20 at  9:30 AM EDT by telephone and verified that I am speaking with the correct person using two identifiers.  Location: [facility] Patient: [name] MRN: [id number] (home) Provider: Clovis Pu, RN   I discussed the limitations, risks, security and privacy concerns of performing an evaluation and management service by telephone and the availability of in person appointments. I also discussed with the patient that there may be a patient responsible charge related to this service. The patient expressed understanding and agreed to proceed.   History of Present Illness: PRENATAL INTAKE SUMMARY  Ms. Corbridge presents today New OB Nurse Interview.  OB History    Gravida  3   Para  1   Term  1   Preterm      AB  1   Living  1     SAB      TAB  1   Ectopic      Multiple      Live Births  1          I have reviewed the patient's medical, obstetrical, social, and family histories, medications, and available lab results.  SUBJECTIVE She complains of vaginal bleeding (light spotting-dark in color). Patient was seen a few weeks ago for abdominal pain and vaginal bleeding. Ultrasound completed to check viability.   Observations/Objective: Initial nurse interview for history/labs (New OB)  EDD: 12/17/2020 by early ultrasound GA: [redacted]w[redacted]d G3P1011 FHT: non face to face interview  GENERAL APPEARANCE: non face to face interview  Assessment and Plan: Normal pregnancy Advised patient to go to MAU for any vaginal bleeding and/or severe abdominal pain Prenatal care- [facility] Labs to be completed at next visit with Raelyn Mora, CNM 06/04/20 Rx for PNV sent to pharmacy Patient need to apply for Medicaid  Follow Up Instructions:   I discussed the assessment and treatment plan with the patient. The patient was provided an opportunity to ask questions and all were answered. The patient agreed  with the plan and demonstrated an understanding of the instructions.   The patient was advised to call back or seek an in-person evaluation if the symptoms worsen or if the condition fails to improve as anticipated.  I provided 30 minutes of non-face-to-face time during this encounter.   Clovis Pu, RN

## 2020-05-19 NOTE — Patient Instructions (Addendum)
First Trimester of Pregnancy  The first trimester of pregnancy is from week 1 until the end of week 13 (months 1 through 3). During this time, your baby will begin to develop inside you. At 6-8 weeks, the eyes and face are formed, and the heartbeat can be seen on ultrasound. At the end of 12 weeks, all the baby's organs are formed. Prenatal care is all the medical care you receive before the birth of your baby. Make sure you get good prenatal care and follow all of your doctor's instructions. Follow these instructions at home: Medicines  Take over-the-counter and prescription medicines only as told by your doctor. Some medicines are safe and some medicines are not safe during pregnancy.  Take a prenatal vitamin that contains at least 600 micrograms (mcg) of folic acid.  If you have trouble pooping (constipation), take medicine that will make your stool soft (stool softener) if your doctor approves. Eating and drinking   Eat regular, healthy meals.  Your doctor will tell you the amount of weight gain that is right for you.  Avoid raw meat and uncooked cheese.  If you feel sick to your stomach (nauseous) or throw up (vomit): ? Eat 4 or 5 small meals a day instead of 3 large meals. ? Try eating a few soda crackers. ? Drink liquids between meals instead of during meals.  To prevent constipation: ? Eat foods that are high in fiber, like fresh fruits and vegetables, whole grains, and beans. ? Drink enough fluids to keep your pee (urine) clear or pale yellow. Activity  Exercise only as told by your doctor. Stop exercising if you have cramps or pain in your lower belly (abdomen) or low back.  Do not exercise if it is too hot, too humid, or if you are in a place of great height (high altitude).  Try to avoid standing for long periods of time. Move your legs often if you must stand in one place for a long time.  Avoid heavy lifting.  Wear low-heeled shoes. Sit and stand up  straight.  You can have sex unless your doctor tells you not to. Relieving pain and discomfort  Wear a good support bra if your breasts are sore.  Take warm water baths (sitz baths) to soothe pain or discomfort caused by hemorrhoids. Use hemorrhoid cream if your doctor says it is okay.  Rest with your legs raised if you have leg cramps or low back pain.  If you have puffy, bulging veins (varicose veins) in your legs: ? Wear support hose or compression stockings as told by your doctor. ? Raise (elevate) your feet for 15 minutes, 3-4 times a day. ? Limit salt in your food. Prenatal care  Schedule your prenatal visits by the twelfth week of pregnancy.  Write down your questions. Take them to your prenatal visits.  Keep all your prenatal visits as told by your doctor. This is important. Safety  Wear your seat belt at all times when driving.  Make a list of emergency phone numbers. The list should include numbers for family, friends, the hospital, and police and fire departments. General instructions  Ask your doctor for a referral to a local prenatal class. Begin classes no later than at the start of month 6 of your pregnancy.  Ask for help if you need counseling or if you need help with nutrition. Your doctor can give you advice or tell you where to go for help.  Do not use hot tubs, steam   rooms, or saunas.  Do not douche or use tampons or scented sanitary pads.  Do not cross your legs for long periods of time.  Avoid all herbs and alcohol. Avoid drugs that are not approved by your doctor.  Do not use any tobacco products, including cigarettes, chewing tobacco, and electronic cigarettes. If you need help quitting, ask your doctor. You may get counseling or other support to help you quit.  Avoid cat litter boxes and soil used by cats. These carry germs that can cause birth defects in the baby and can cause a loss of your baby (miscarriage) or stillbirth.  Visit your dentist.  At home, brush your teeth with a soft toothbrush. Be gentle when you floss. Contact a doctor if:  You are dizzy.  You have mild cramps or pressure in your lower belly.  You have a nagging pain in your belly area.  You continue to feel sick to your stomach, you throw up, or you have watery poop (diarrhea).  You have a bad smelling fluid coming from your vagina.  You have pain when you pee (urinate).  You have increased puffiness (swelling) in your face, hands, legs, or ankles. Get help right away if:  You have a fever.  You are leaking fluid from your vagina.  You have spotting or bleeding from your vagina.  You have very bad belly cramping or pain.  You gain or lose weight rapidly.  You throw up blood. It may look like coffee grounds.  You are around people who have German measles, fifth disease, or chickenpox.  You have a very bad headache.  You have shortness of breath.  You have any kind of trauma, such as from a fall or a car accident. Summary  The first trimester of pregnancy is from week 1 until the end of week 13 (months 1 through 3).  To take care of yourself and your unborn baby, you will need to eat healthy meals, take medicines only if your doctor tells you to do so, and do activities that are safe for you and your baby.  Keep all follow-up visits as told by your doctor. This is important as your doctor will have to ensure that your baby is healthy and growing well. This information is not intended to replace advice given to you by your health care provider. Make sure you discuss any questions you have with your health care provider. Document Revised: 11/09/2018 Document Reviewed: 07/27/2016 Elsevier Patient Education  2020 [employer] Inc.  Vaginal Bleeding During Pregnancy, First Trimester  A small amount of bleeding (spotting) from the vagina is common during early pregnancy. Sometimes the bleeding is normal and does not cause problems. At other times,  though, bleeding may be a sign of something serious. Tell your doctor about any bleeding from your vagina right away. Follow these instructions at home: Activity  Follow your doctor's instructions about how active you can be.  If needed, make plans for someone to help with your normal activities.  Do not have sex or orgasms until your doctor says that this is safe. General instructions  Take over-the-counter and prescription medicines only as told by your doctor.  Watch your condition for any changes.  Write down: ? The number of pads you use each day. ? How often you change pads. ? How soaked (saturated) your pads are.  Do not use tampons.  Do not douche.  If you pass any tissue from your vagina, save it to show to your   doctor.  Keep all follow-up visits as told by your doctor. This is important. Contact a doctor if:  You have vaginal bleeding at any time while you are pregnant.  You have cramps.  You have a fever. Get help right away if:  You have very bad cramps in your back or belly (abdomen).  You pass large clots or a lot of tissue from your vagina.  Your bleeding gets worse.  You feel light-headed.  You feel weak.  You pass out (faint).  You have chills.  You are leaking fluid from your vagina.  You have a gush of fluid from your vagina. Summary  Sometimes vaginal bleeding during pregnancy is normal and does not cause problems. At other times, bleeding may be a sign of something serious.  Tell your doctor about any bleeding from your vagina right away.  Follow your doctor's instructions about how active you can be. You may need someone to help you with your normal activities. This information is not intended to replace advice given to you by your health care provider. Make sure you discuss any questions you have with your health care provider. Document Revised: 11/07/2018 Document Reviewed: 10/20/2016 Elsevier Patient Education  2020 Elsevier  Inc.  Warning Signs During Pregnancy A pregnancy lasts about 40 weeks, starting from the first day of your last period until the baby is born. Pregnancy is divided into three phases called trimesters.  The first trimester refers to week 1 through week 13 of pregnancy.  The second trimester is the start of week 14 through the end of week 27.  The third trimester is the start of week 28 until you deliver your baby. During each trimester of pregnancy, certain signs and symptoms may indicate a problem. Talk with your health care provider about your current health and any medical conditions you have. Make sure you know the symptoms that you should watch for and report. How does this affect me?  Warning signs in the first trimester While some changes during the first trimester may be uncomfortable, most do not represent a serious problem. Let your health care provider know if you have any of the following warning signs in the first trimester:  You cannot eat or drink without vomiting, and this lasts for longer than a day.  You have vaginal bleeding or spotting along with menstrual-like cramping.  You have diarrhea for longer than a day.  You have a fever or other signs of infection, such as: ? Pain or burning when you urinate. ? Foul smelling or thick or yellowish vaginal discharge. Warning signs in the second trimester As your baby grows and changes during the second trimester, there are additional signs and symptoms that may indicate a problem. These include:  Signs and symptoms of infection, including a fever.  Signs or symptoms of a miscarriage or preterm labor, such as regular contractions, menstrual-like cramping, or lower abdominal pain.  Bloody or watery vaginal discharge or obvious vaginal bleeding.  Feeling like your heart is pounding.  Having trouble breathing.  Nausea, vomiting, or diarrhea that lasts for longer than a day.  Craving non-food items, such as clay, chalk,  or dirt. This may be a sign of a very treatable medical condition called pica. Later in your second trimester, watch for signs and symptoms of a serious medical condition called preeclampsia.These include:  Changes in your vision.  A severe headache that does not go away.  Nausea and vomiting. It is also important to notice if   your baby stops moving or moves less than usual during this time. Warning signs in the third trimester As you approach the third trimester, your baby is growing and your body is preparing for the birth of your baby. In your third trimester, be sure to let your health care provider know if:  You have signs and symptoms of infection, including a fever.  You have vaginal bleeding.  You notice that your baby is moving less than usual or is not moving.  You have nausea, vomiting, or diarrhea that lasts for longer than a day.  You have a severe headache that does not go away.  You have vision changes, including seeing spots or having blurry or double vision.  You have increased swelling in your hands or face. How does this affect my baby? Throughout your pregnancy, always report any of the warning signs of a problem to your health care provider. This can help prevent complications that may affect your baby, including:  Increased risk for premature birth.  Infection that may be transmitted to your baby.  Increased risk for stillbirth. Contact a health care provider if:  You have any of the warning signs of a problem for the current trimester of your pregnancy.  Any of the following apply to you during any trimester of pregnancy: ? You have strong emotions, such as sadness or anxiety, that interfere with work or personal relationships. ? You feel unsafe in your home and need help finding a safe place to live. ? You are using tobacco products, alcohol, or drugs and you need help to stop. Get help right away if: You have signs or symptoms of labor before 37  weeks of pregnancy. These include:  Contractions that are 5 minutes or less apart, or that increase in frequency, intensity, or length.  Sudden, sharp abdominal pain or low back pain.  Uncontrolled gush or trickle of fluid from your vagina. Summary  A pregnancy lasts about 40 weeks, starting from the first day of your last period until the baby is born. Pregnancy is divided into three phases called trimesters. Each trimester has warning signs to watch for.  Always report any warning signs to your health care provider in order to prevent complications that may affect both you and your baby.  Talk with your health care provider about your current health and any medical conditions you have. Make sure you know the symptoms that you should watch for and report. This information is not intended to replace advice given to you by your health care provider. Make sure you discuss any questions you have with your health care provider. Document Revised: 11/07/2018 Document Reviewed: 05/05/2017 Elsevier Patient Education  2020 [employer] Inc.  

## 2020-05-19 NOTE — MAU Provider Note (Signed)
History     CSN: [id number]  Arrival date and time: 05/19/20 1154   First Provider Initiated Contact with Patient 05/19/20 1322      Chief Complaint  Patient presents with   Vaginal Bleeding   Abdominal Pain   This is a 34 year old G3, P1 at 9 weeks and 5 days who presents with vaginal bleeding. She was seen for this several weeks ago on 9/20. She had a [place] at that time, showing IUP with FHR of 96. Since that time, she had brown discharge today, she had a sudden onset of bright red bleeding through her jeans.  She had months some mild cramping.  She has passed some small blood clots, although has not seen any white or gray tissue.  Denies any other discharge, pelvic pain.  Does have some mild nausea.   OB History    Gravida  3   Para  1   Term  1   Preterm      AB  1   Living  1     SAB      TAB  1   Ectopic      Multiple      Live Births  1           Past Medical History:  Diagnosis Date   ADD (attention deficit disorder)    Allergy    RHINITIS   BV (bacterial vaginosis)    Eczema    Headache(784.0)    intermittent   Hypothyroidism    Obesity    UTI (lower urinary tract infection)    not frequently    Past Surgical History:  Procedure Laterality Date   WISDOM TOOTH EXTRACTION      Family History  Problem Relation Age of Onset   Fibromyalgia Mother    Lupus Mother    Obesity Mother    Kidney disease Mother    Deep vein thrombosis Father    Hypertension Father    Diabetes Father    Obesity Father    Asthma Sister    Seizures Sister    Hypertension Maternal Grandmother    Heart disease Maternal Grandmother    Kidney disease Maternal Grandmother    Diabetes Maternal Grandmother    Hypertension Maternal Grandfather    Cancer Maternal Grandfather    Alzheimer's disease Paternal Grandmother    Diabetes Paternal Grandfather     Social History   Tobacco Use   Smoking status: Never Smoker   Smokeless  tobacco: Never Used  Building services engineer Use: Never used  Substance Use Topics   Alcohol use: Not Currently    Comment: 1-2 nights weekly   Drug use: No    Allergies: No Known Allergies  Medications Prior to Admission  Medication Sig Dispense Refill Last Dose   levothyroxine (SYNTHROID) 50 MCG tablet Take 50 mcg by mouth daily before breakfast.   05/19/2020 at Unknown time   prenatal vitamin w/FE, FA (PRENATAL 1 + 1) 27-1 MG TABS tablet Take 1 tablet by mouth daily at 12 noon. 30 tablet 0 05/19/2020 at Unknown time   Prenat-Fe Carbonyl-FA-Omega 3 (ONE-A-DAY WOMENS PRENATAL 1 PO) Take 1 tablet by mouth daily.       Review of Systems Physical Exam   Blood pressure 103/78, pulse 100, temperature 98.3 F (36.8 C), resp. rate 16, weight 126.6 kg, last menstrual period 04/18/2020, SpO2 100 %.  Physical Exam Vitals reviewed. Exam conducted with a chaperone present.  Constitutional:  Appearance: She is well-developed.  Abdominal:     Palpations: Abdomen is soft.     Hernia: There is no hernia in the left inguinal area or right inguinal area.  Genitourinary:    Labia:        Right: No rash, tenderness or lesion.        Left: No rash, tenderness or lesion.      Vagina: No signs of injury and foreign body. No vaginal discharge, erythema, tenderness or bleeding.     Cervix: No cervical motion tenderness, discharge, friability, lesion, erythema or cervical bleeding.     Comments: Blood clot in vault. No tissue or bleeding in cervix. Lymphadenopathy:     Lower Body: No right inguinal adenopathy.  Neurological:     Mental Status: She is alert.    US OB Transvaginal  Result Date: 05/19/2020 CLINICAL DATA:  Pregnant patient with vaginal bleeding for 1 day. EXAM: TRANSVAGINAL OB ULTRASOUND TECHNIQUE: Transvaginal ultrasound was performed for complete evaluation of the gestation as well as the maternal uterus, adnexal regions, and pelvic cul-de-sac. COMPARISON:  Early OB  ultrasound 04/21/2020. FINDINGS: Intrauterine gestational sac: Single. Yolk sac:  Visualized. Embryo:  Visualized. Cardiac Activity: Detected. Heart Rate: 174 bpm CRL:   31.6 mm   10 w 0 d                  [place] EDC: 12/15/2020. Subchorionic hemorrhage: Small subchorionic hemorrhage is identified. Maternal uterus/adnexae: The left ovary appears normal. The right ovary was not seen. IMPRESSION: Single living intrauterine pregnancy. Small subchorionic hemorrhage. Electronically Signed   By: Drusilla Kanner M.D.   On: 05/19/2020 14:21     MAU Course  Procedures [place] today shows small subchorionic bleeding.  MDM   Assessment and Plan     ICD-10-CM   1. [redacted] weeks gestation of pregnancy  Z3A.10   2. Vaginal bleeding  N93.9 US OB Transvaginal    US OB Transvaginal    CANCELED: US OB Comp Less 14 Wks    CANCELED: US OB Comp Less 14 Wks  3. Subchorionic hematoma in first trimester, single or unspecified fetus  O41.8X10    O46.8X1    Patient reassured.  ABO Rh shows Rh positive. No rhogam needed. F/u outpt. Return precautions given.  Levie Heritage 05/19/2020, 1:29 PM

## 2020-05-19 NOTE — Discharge Instructions (Signed)

## 2020-06-04 ENCOUNTER — Encounter: Payer: Self-pay | Admitting: General Practice

## 2020-06-04 ENCOUNTER — Other Ambulatory Visit: Payer: Self-pay

## 2020-06-04 ENCOUNTER — Other Ambulatory Visit (HOSPITAL_COMMUNITY)
Admission: RE | Admit: 2020-06-04 | Discharge: 2020-06-04 | Disposition: A | Discharge status: Home / Self Care | Payer: Self-pay | Source: Ambulatory Visit | Attending: Obstetrics and Gynecology | Admitting: Obstetrics and Gynecology

## 2020-06-04 ENCOUNTER — Ambulatory Visit (INDEPENDENT_AMBULATORY_CARE_PROVIDER_SITE_OTHER): Payer: Self-pay | Admitting: Licensed Clinical Social Worker

## 2020-06-04 ENCOUNTER — Telehealth: Payer: Self-pay | Admitting: General Practice

## 2020-06-04 ENCOUNTER — Encounter: Payer: Self-pay | Admitting: Obstetrics and Gynecology

## 2020-06-04 ENCOUNTER — Ambulatory Visit (INDEPENDENT_AMBULATORY_CARE_PROVIDER_SITE_OTHER): Payer: Self-pay | Admitting: Obstetrics and Gynecology

## 2020-06-04 VITALS — BP 131/83 | HR 87 | Temp 98.0°F | Wt 283.6 lb

## 2020-06-04 DIAGNOSIS — E0590 Thyrotoxicosis, unspecified without thyrotoxic crisis or storm: Secondary | ICD-10-CM

## 2020-06-04 DIAGNOSIS — Z3A12 12 weeks gestation of pregnancy: Secondary | ICD-10-CM

## 2020-06-04 DIAGNOSIS — Z3480 Encounter for supervision of other normal pregnancy, unspecified trimester: Secondary | ICD-10-CM | POA: Insufficient documentation

## 2020-06-04 DIAGNOSIS — F4321 Adjustment disorder with depressed mood: Secondary | ICD-10-CM

## 2020-06-04 DIAGNOSIS — O99280 Endocrine, nutritional and metabolic diseases complicating pregnancy, unspecified trimester: Secondary | ICD-10-CM

## 2020-06-04 NOTE — Telephone Encounter (Signed)
Financial application given to patient. 

## 2020-06-04 NOTE — Progress Notes (Addendum)
INITIAL OBSTETRICAL VISIT Patient name: [name] MRN [id number]  Date of birth: 19-Aug-1985 Chief Complaint:   Initial Prenatal Visit  History of Present Illness:   [name] is a 34 y.o. G32P1011 African American female at [redacted]w[redacted]d by 5.[redacted] weeks gestation U/S with an Estimated Date of Delivery: 12/17/20 being seen today for her initial obstetrical visit. Her obstetrical history is significant for hypothyroidism, EAB @ [redacted] weeks gestation in 2007 - no complications; FT SVD, female, in 2009, no complications. This is an unplanned pregnancy. She and the father of the baby (FOB) "Antonio" do not live together. She has a support system that consists of the FOB/family/friends. Today she reports occasional pelvic pressure.   Patient's last menstrual period was 04/18/2020. Last pap 2020. Results were: normal per patient (records requested) Review of Systems:   Pertinent items are noted in HPI Denies cramping/contractions, leakage of fluid, vaginal bleeding, abnormal vaginal discharge w/ itching/odor/irritation, headaches, visual changes, shortness of breath, chest pain, abdominal pain, severe nausea/vomiting, or problems with urination or bowel movements unless otherwise stated above.  Pertinent History Reviewed:  Reviewed past medical,surgical, social, obstetrical and family history.  Reviewed problem list, medications and allergies. OB History  Gravida Para Term Preterm AB Living  3 1 1   1 1   SAB TAB Ectopic Multiple Live Births    1     1    # Outcome Date GA Lbr Len/2nd Weight Sex Delivery Anes PTL Lv  3 Current           2 Term 01/16/08 [redacted]w[redacted]d  7 lb 11 oz (3.487 kg) M Vag-Spont EPI N LIV  1 TAB 2007           Physical Assessment:   Vitals:   06/04/20 0839  BP: 131/83  Pulse: 87  Temp: 98 F (36.7 C)  Weight: 283 lb 9.6 oz (128.6 kg)  Body mass index is 46.48 kg/m.       Physical Examination:  General appearance - well appearing, and in no distress  Mental status - alert, oriented  to person, place, and time  Psych:  She has a normal mood and affect  Skin - warm and dry, normal color, no suspicious lesions noted  Chest - effort normal, all lung fields clear to auscultation bilaterally  Heart - normal rate and regular rhythm  Abdomen - soft, nontender  Extremities:  No swelling or varicosities noted  Pelvic - VULVA: normal appearing vulva with no masses, tenderness or lesions  VAGINA: normal appearing vagina with normal color and discharge, no lesions.   CERVIX: normal appearing cervix without discharge or lesions, no CMT    FHTs by doppler: 158 bpm  Assessment & Plan:  1) Low-Risk Pregnancy G3P1011 at [redacted]w[redacted]d with an Estimated Date of Delivery: 12/17/20   2) Initial OB visit - Welcomed to practice and introduced self to patient in addition to discussing other advanced practice providers that she may be seeing at this practice - Congratulated patient - Anticipatory guidance on upcoming appointments - Educated on COVID19 and pregnancy and the integration of virtual appointments  - Educated on babyscripts app- patient reports she has not received email, encouraged to look in spam folder and to call office if she still has not received email - patient verbalizes understanding    3) Supervision of other normal pregnancy, antepartum - CBC/D/Plt+RPR+Rh+ABO+Rub Ab... - Genetic Screening - Hemoglobin A1c - Culture, OB Urine - Glucose - Cervicovaginal ancillary only( [place]) - 12/19/20 MFM OB COMP +  14 WK; Future - Ambulatory referral to Integrated Behavioral Health  4) Hyperthyroidism affecting pregnancy, antepartum  - Managed by [facility] Allyne Gee, [place]); last labs 3-4 months ago per pt - Takes Synthroid 50 mcg daily - Thyroid Panel With TSH   5) [redacted] weeks gestation of pregnancy    Meds: No orders of the defined types were placed in this encounter.   Initial labs obtained Continue prenatal vitamins Reviewed n/v relief measures and warning s/s  to report Reviewed recommended weight gain based on pre-gravid BMI Encouraged well-balanced diet Genetic Screening discussed: ordered Cystic fibrosis, SMA, Fragile X screening discussed ordered The nature of [facility] with multiple MDs and other Advanced Practice Providers was explained to patient; also emphasized that residents, students are part of our team.  Discussed optimized OB schedule and video visits. Advised can have an in-office visit whenever she feels she needs to be seen.  Does not have own BP cuff. Will have in-office visits until the appropriately sized BP cuff can be found. Advised to call during normal business hours and there is an after-hours nurse line available.    Follow-up: Return in about 4 weeks (around 07/02/2020) for Return OB visit.   Orders Placed This Encounter  Procedures  . Culture, OB Urine  . [place] MFM OB COMP + 14 WK  . CBC/D/Plt+RPR+Rh+ABO+Rub Ab...  . Genetic Screening  . Hemoglobin A1c  . Thyroid Panel With TSH  . Glucose  . Ambulatory referral to [facility]    Raelyn Mora MSN, [place] 06/04/2020

## 2020-06-04 NOTE — Patient Instructions (Addendum)
Pregnancy and Hypothyroidism Hypothyroidism is a condition that develops if your thyroid has low activity. The thyroid is a small, butterfly-shaped gland in your neck. It is located in front of your windpipe. It makes hormones that play an important role in regulating your breathing, heart rate, menstrual cycle, body temperature, and other bodily functions. If you have hypothyroidism, your thyroid gland does not produce enough thyroid hormones. When you are pregnant, your body uses more thyroid hormones. This can cause mild hypothyroidism to get worse. How does this affect me? Hypothyroidism during pregnancy can cause you to have:  Fatigue.  Abnormal weight gain. For women of normal weight, it is common to gain about 1 pound per week during pregnancy.  Difficulty having a bowel movement (constipation).  Feeling cold more often than others do.  Muscle aches.  Pregnancy complications, such as: ? High blood pressure that develops after the 20th week of pregnancy (preeclampsia). ? Pregnancy loss (miscarriage). ? Preterm birth. ? Placenta problems. How does this affect my baby? Hypothyroidism can also affect your baby. Babies need thyroid hormone from their mothers for normal growth and brain development. Babies born to mothers with hypothyroidism during pregnancy may:  Be born prematurely.  Have low birth weight.  Have mental delays.  May develop hypothyroidism. This is rare. What can I do to lower my risk? Some women with hypothyroidism need extra iodine during pregnancy. Your health care provider may recommend that you:  Eat foods with iodine, such as: ? Iodized salt. ? Pasteurized eggs and dairy products. ? Low-mercury seafood.  Take a prenatal vitamin that contains iodine.  Take iodine supplements. How is this treated? Treatment may include:  Monitoring. If you have mild hypothyroidism, your health care provider will monitor your thyroid hormone levels closely to watch  for any changes.  Medicines. Your health care provider may prescribe medicine to control your thyroid hormone levels. Follow these instructions at home:  Take over-the-counter and prescription medicines only as told by your health care provider. ? Check with your health care provider before taking any hypothyroid medicines that were prescribed before you became pregnant. Many are safe, but some treatments for hypothyroidism may have to be stopped during pregnancy.  You may be asked to perform kick counts to monitor your baby's movements. If your baby moves fewer than 10 times in 2 hours during a period when the baby is usually active (typically in the evening), you should see your health care provider right away.  Take a prenatal vitamin as told by your health care provider.  Keep all follow-up visits. This is important. Contact a health care provider if you:  Have new symptoms or your symptoms get worse.  Gain more than 5 lb (2.3 kg) in 1 week.  Have a lump in your neck.  Have a scratchy throat or difficulty speaking that lasts longer than a month and is not related to a cold.  Have a hard time swallowing. Get help right away if:  Your baby is less active than normal.  Your baby stops moving completely.  You develop muscle cramps.  You have pain in your abdomen.  You have heavy bleeding.  You develop a fever or chills.  You have a very bad headache or vision problems.  You develop swelling in your legs and ankles. Summary  Hypothyroidism is a condition that develops if your thyroid has low activity.  Hypothyroidism during pregnancy can lead to complications for both you and your baby.  Take medicines, vitamins, and   supplements as told by your health care provider during your pregnancy to control your condition.  Keep regular prenatal appointments so your health care provider can closely monitor your condition during pregnancy. This information is not intended to  replace advice given to you by your health care provider. Make sure you discuss any questions you have with your health care provider. Document Revised: 11/10/2018 Document Reviewed: 08/23/2017 Elsevier Patient Education  2020 [employer].

## 2020-06-05 LAB — CBC/D/PLT+RPR+RH+ABO+RUB AB...
Antibody Screen: NEGATIVE
Basophils Absolute: 0 10*3/uL (ref 0.0–0.2)
Basos: 0 %
EOS (ABSOLUTE): 0.1 10*3/uL (ref 0.0–0.4)
Eos: 2 %
HCV Ab: <0.1 s/co ratio (ref 0.0–0.9)
HIV Screen 4th Generation wRfx: NONREACTIVE
Hematocrit: 34 % (ref 34.0–46.6)
Hemoglobin: 11 g/dL — ABNORMAL LOW (ref 11.1–15.9)
Hepatitis B Surface Ag: NEGATIVE
Immature Grans (Abs): 0 10*3/uL (ref 0.0–0.1)
Immature Granulocytes: 1 %
Lymphocytes Absolute: 1.8 10*3/uL (ref 0.7–3.1)
Lymphs: 26 %
MCH: 28.6 pg (ref 26.6–33.0)
MCHC: 32.4 g/dL (ref 31.5–35.7)
MCV: 89 fL (ref 79–97)
Monocytes Absolute: 0.3 10*3/uL (ref 0.1–0.9)
Monocytes: 5 %
Neutrophils Absolute: 4.4 10*3/uL (ref 1.4–7.0)
Neutrophils: 66 %
Platelets: 371 10*3/uL (ref 150–450)
RBC: 3.84 x10E6/uL (ref 3.77–5.28)
RDW: 13.8 % (ref 11.7–15.4)
RPR Ser Ql: NONREACTIVE
Rh Factor: POSITIVE
Rubella Antibodies, IGG: 4.22 index (ref >0.99)
WBC: 6.7 10*3/uL (ref 3.4–10.8)

## 2020-06-05 LAB — CERVICOVAGINAL ANCILLARY ONLY
Bacterial Vaginitis (gardnerella): NEGATIVE
Candida Glabrata: NEGATIVE
Candida Vaginitis: NEGATIVE
Chlamydia: NEGATIVE
Comment: NEGATIVE
Comment: NEGATIVE
Comment: NEGATIVE
Comment: NEGATIVE
Comment: NEGATIVE
Comment: NORMAL
Neisseria Gonorrhea: NEGATIVE
Trichomonas: NEGATIVE

## 2020-06-05 LAB — HEMOGLOBIN A1C
Est. average glucose Bld gHb Est-mCnc: 114 mg/dL
Hgb A1c MFr Bld: 5.6 % (ref 4.8–5.6)

## 2020-06-05 LAB — THYROID PANEL WITH TSH
Free Thyroxine Index: 1.2 (ref 1.2–4.9)
T3 Uptake Ratio: 10 % — ABNORMAL LOW (ref 24–39)
T4, Total: 12 ug/dL (ref 4.5–12.0)
TSH: 4.06 u[IU]/mL (ref 0.450–4.500)

## 2020-06-05 LAB — HCV INTERPRETATION

## 2020-06-05 LAB — GLUCOSE, RANDOM: Glucose: 92 mg/dL (ref 65–99)

## 2020-06-05 NOTE — BH Specialist Note (Signed)
Integrated Behavioral Health Initial Visit  MRN: [id number] Name: Cori Justus  Number of Integrated Behavioral Health Clinician visits:: 1 Session Start time: 10:15am  Session End time: 10:50am Total time: 35 in person at [facility]  Type of Service: Integrated Behavioral Health- Individual Interpretor:no Interpretor Name and Language: None   Warm Hand Off Completed.       SUBJECTIVE: Cherree Conerly is a 34 y.o. female accompanied by n/a Patient was referred by R. Arita Miss CNM for elevated phq9. Patient reports the following symptoms/concerns: depressed mood, fatigue and trouble concentrating  Duration of problem: approx 1 year ; Severity of problem: mild  OBJECTIVE: Mood: good and Affect: normal  Risk of harm to self or others: No risk of harm to self or others  LIFE CONTEXT: Family and Social: Lives with 12 yr son in [place]. Moving in with Father of Baby soon.  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 Reduce symptoms of: adjustment disorder with depressed mood  2. Increase knowledge and/or ability of:  Diagnosis and implement coping skills to alleviate symptoms  3. Demonstrate ability to: self manage symptoms   INTERVENTIONS: Interventions utilized:    Initial Prenatal from 06/04/2020 in [facility]  PHQ-9 Total Score 10      Standardized Assessments completed: supportive counseling  ASSESSMENT: Patient currently experiencing adjustment disorder with depressed mood    Patient may benefit from integrated behavioral health   PLAN: 1. Follow up with behavioral health clinician on : 4 weeks in person or mychart  2. Behavioral recommendations: engage in self care techniques, continue taking prenatal vitamins as directed my provider, recognize and develop personal strengths 3. Referral(s): wic  4. "From scale of 1-10, how likely are you to follow plan?":   Gwyndolyn Saxon, LCSW

## 2020-06-06 LAB — CULTURE, OB URINE

## 2020-06-06 LAB — URINE CULTURE, OB REFLEX

## 2020-06-11 ENCOUNTER — Telehealth: Payer: Self-pay | Admitting: *Deleted

## 2020-06-11 NOTE — Telephone Encounter (Signed)
Patient called regarding Panorama results. Patient verified DOB. Low risk girl, fetal fraction 3.8%. Horizon results pending.  Clovis Pu, RN

## 2020-06-12 ENCOUNTER — Encounter: Payer: Self-pay | Admitting: General Practice

## 2020-06-13 ENCOUNTER — Encounter: Payer: Self-pay | Admitting: General Practice

## 2020-06-18 ENCOUNTER — Encounter: Payer: Self-pay | Admitting: General Practice

## 2020-06-18 ENCOUNTER — Other Ambulatory Visit: Payer: Self-pay | Admitting: Obstetrics and Gynecology

## 2020-06-18 DIAGNOSIS — Z3480 Encounter for supervision of other normal pregnancy, unspecified trimester: Secondary | ICD-10-CM

## 2020-07-01 ENCOUNTER — Telehealth: Payer: Self-pay | Admitting: General Practice

## 2020-07-01 ENCOUNTER — Other Ambulatory Visit: Payer: Self-pay | Admitting: *Deleted

## 2020-07-01 DIAGNOSIS — Z3480 Encounter for supervision of other normal pregnancy, unspecified trimester: Secondary | ICD-10-CM

## 2020-07-01 MED ORDER — M-NATAL PLUS 27-1 MG PO TABS: ORAL_TABLET | ORAL | 12 refills | Status: DC

## 2020-07-01 NOTE — Telephone Encounter (Signed)
Patient informed of [place] with [facility] at [facility] on 07/31/2020 at 9:30am.  Pt stated that she had been having some bleeding.  Advised patient that RN wasn't available and if she keeps bleeding to go to MAU for evaluation and to keep appointment that is scheduled on 07/03/2020 at 10:30am. Pt verbalized understanding.

## 2020-07-03 ENCOUNTER — Other Ambulatory Visit: Payer: Self-pay

## 2020-07-03 ENCOUNTER — Ambulatory Visit (INDEPENDENT_AMBULATORY_CARE_PROVIDER_SITE_OTHER): Payer: Self-pay | Admitting: Licensed Clinical Social Worker

## 2020-07-03 ENCOUNTER — Ambulatory Visit (INDEPENDENT_AMBULATORY_CARE_PROVIDER_SITE_OTHER): Payer: Self-pay | Admitting: Obstetrics and Gynecology

## 2020-07-03 VITALS — BP 109/70 | HR 99 | Temp 98.1°F | Wt 287.2 lb

## 2020-07-03 DIAGNOSIS — O468X2 Other antepartum hemorrhage, second trimester: Secondary | ICD-10-CM

## 2020-07-03 DIAGNOSIS — Z3480 Encounter for supervision of other normal pregnancy, unspecified trimester: Secondary | ICD-10-CM

## 2020-07-03 DIAGNOSIS — F4321 Adjustment disorder with depressed mood: Secondary | ICD-10-CM

## 2020-07-03 DIAGNOSIS — O418X20 Other specified disorders of amniotic fluid and membranes, second trimester, not applicable or unspecified: Secondary | ICD-10-CM

## 2020-07-03 NOTE — BH Specialist Note (Signed)
Integrated Behavioral Health Follow Up In-Person Visit  MRN: [id number] Name: Rudie Rikard  Number of Integrated Behavioral Health Clinician visits: 2/6 Session Start time: 12:35pm   Session End time: 12:50pm Total time: 15 minutes in person at [facility]   Types of Service: General Behavioral Integrated Care (BHI)  Interpretor:No. Interpretor Name and Language: none  Subjective: Chakira Jachim is a 34 y.o. female accompanied by nia Patient was referred by R. Arita Miss CNM for anxiety. 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  Reduce symptoms of: anxiety  2.  Increase knowledge and/or ability of: healthy habits  3.  Demonstrate ability to: Increase healthy adjustment to current life circumstances  Progress towards Goals: Ongoing  Interventions: Interventions utilized:  Supportive Counseling Standardized Assessments completed: PHQ 9  Assessment: Patient currently experiencing adjustment disorder   Patient may benefit from integrated behavioral health   Plan: 1. Follow up with behavioral health clinician on : 08/07/2020 2. Behavioral recommendations: Learn emotional and behavioral triggers at contribute to anxiety, create boundaries to prevent burnout and communicate needs to father of baby 3. Referral(s): Integrated [employer] (In Clinic) 4. "From scale of 1-10, how likely are you to follow plan?":   Gwyndolyn Saxon, LCSW

## 2020-07-06 ENCOUNTER — Encounter: Payer: Self-pay | Admitting: Obstetrics and Gynecology

## 2020-07-06 NOTE — Progress Notes (Signed)
LOW-RISK PREGNANCY OFFICE VISIT Patient name: [name] MRN [id number]  Date of birth: 04/28/1986 Chief Complaint:   Routine Prenatal Visit  History of Present Illness:   [name] is a 34 y.o. G13P1011 female at 29w1dwith an Estimated Date of Delivery: 12/17/20 being seen today for ongoing management of a low-risk pregnancy.  Today she reports spotting brown x 1 day last week, but none now. [facility] Contractions: Not present. Vag. Bleeding: None.  Movement: Present. denies leaking of fluid. Review of Systems:   Pertinent items are noted in HPI Denies abnormal vaginal discharge w/ itching/odor/irritation, headaches, visual changes, shortness of breath, chest pain, abdominal pain, severe nausea/vomiting, or problems with urination or bowel movements unless otherwise stated above. Pertinent History Reviewed:  Reviewed past medical,surgical, social, obstetrical and family history.  Reviewed problem list, medications and allergies. Physical Assessment:   Vitals:   07/03/20 1111 07/03/20 1203  BP: 140/87 109/70  Pulse: 99 99  Temp: 98.1 F (36.7 C)   Weight: 287 lb 3.2 oz (130.3 kg)   Body mass index is 47.07 kg/m.        Physical Examination:   General appearance: Well appearing, and in no distress  Mental status: Alert, oriented to person, place, and time  Skin: Warm & dry  Cardiovascular: Normal heart rate noted  Respiratory: Normal respiratory effort, no distress  Abdomen: Soft, gravid, nontender  Pelvic: Cervical exam deferred         Extremities: Edema: None  Fetal Status: Fetal Heart Rate (bpm): 150   Movement: Present    No results found for this or any previous visit (from the past 24 hour(s)).  Assessment & Plan:  1) Low-risk pregnancy G3P1011 at 121w4dith an Estimated Date of Delivery: 12/17/20   2) Supervision of other normal pregnancy, antepartum - Discussed normal second trimester occurrences -Anticipatory guidance for anatomy u/s -  Desires waterbirth - discussed waterbirth overview. Plan to schedule H2O birth class  3) Subchorionic hemorrhage of placenta in second trimester, single or unspecified fetus - Discussed brown spotting being normal variation of [place] Bleeding precautions given - Placenta re-evaluated vat anatomy u/s   Meds: No orders of the defined types were placed in this encounter.  Labs/procedures today: met with [facility] - AnLynnea FerrierLCSW  Plan:  Continue routine obstetrical care   Reviewed: Preterm labor symptoms and general obstetric precautions including but not limited to vaginal bleeding, contractions, leaking of fluid and fetal movement were reviewed in detail with the patient.  All questions were answered. Has home bp cuff. BP not elevated when at home per patient. Check bp weekly, let usKoreanow if >140/90.   Follow-up: Return in about 5 weeks (around 08/07/2020) for Return OB - My Chart video.  No orders of the defined types were placed in this encounter.  RoLaury DeepSN, CNM 07/03/2020 11:20 AM

## 2020-07-06 NOTE — Patient Instructions (Signed)
Considering Waterbirth? Guide for patients at [facility] ([facility]) Why consider waterbirth? . Gentle birth for babies  . Less pain medicine used in labor  . May allow for passive descent/less pushing  . May reduce perineal tears  . More mobility and instinctive maternal position changes  . Increased maternal relaxation   Is waterbirth safe? What are the risks of infection, drowning or other complications? . Infection:  . Very low risk (3.7 % for tub vs 4.8% for bed)  . 7 in 8000 waterbirths with documented infection  . Poorly cleaned equipment most common cause  . Slightly lower group B strep transmission rate  . Drowning  . Maternal:  . Very low risk  . Related to seizures or fainting  . Newborn:  . Very low risk. No evidence of increased risk of respiratory problems in multiple large studies  . Physiological protection from breathing under water  . Avoid underwater birth if there are any fetal complications  . Once baby's head is out of the water, keep it out.  . Birth complication  . Some reports of cord trauma, but risk decreased by bringing baby to surface gradually  . No evidence of increased risk of shoulder dystocia. Mothers can usually change positions faster in water than in a bed, possibly aiding the maneuvers to free the shoulder.   There are 2 things you MUST do to have a waterbirth with [facility]: 1. Attend a waterbirth class at [facility] [place]   a. 3rd Wednesday of every month from 7-9 pm (virtual during COVID) b. Free c. Register by calling [phone] or register online at [email or web address] d. Bring us the certificate from the class to your prenatal appointment or send via MyChart 2. Meet with a midwife at 36 weeks* to see if you can still plan a waterbirth and to sign the consent.   *We also recommend that you schedule as many of your prenatal visits with a midwife as possible.    Helpful information: . You may  want to bring a bathing suit top to the hospital to wear during labor but this is optional.  All other supplies are provided by the hospital. . Please arrive at the hospital with signs of active labor, and do not wait at home until late in labor. It takes 45 min- 2 hours for COVID testing, fetal monitoring, and check in to your room to take place, plus transport and filling of the waterbirth tub.    Things that would prevent you from having a waterbirth: . Unknown or Positive COVID-19 diagnosis upon admission to hospital* . Premature, <37wks  . Previous cesarean birth  . Presence of thick meconium-stained fluid  . Multiple gestation (Twins, triplets, etc.)  . Uncontrolled diabetes or gestational diabetes requiring medication  . Hypertension diagnosed in pregnancy or preexisting hypertension (gestational hypertension, preeclampsia, or chronic hypertension) . Heavy vaginal bleeding  . Non-reassuring fetal heart rate  . Active infection (MRSA, etc.). Group B Strep is NOT a contraindication for waterbirth.  . If your labor has to be induced and induction method requires continuous monitoring of the baby's heart rate  . Other risks/issues identified by your obstetrical provider   Please remember that birth is unpredictable. Under certain unforeseeable circumstances your provider may advise against giving birth in the tub. These decisions will be made on a case-by-case basis and with the safety of you and your baby as our highest priority.   *Please remember that in order   to have a waterbirth, you must test Negative to COVID-19 upon admission to the hospital.  Updated 06/17/2020  

## 2020-07-23 ENCOUNTER — Ambulatory Visit: Payer: Self-pay

## 2020-08-02 NOTE — L&D Delivery Note (Addendum)
Delivery Note  [name] is a 35 y.o. female G3P1011 with IUP at [redacted]w[redacted]d admitted for labor .  She progressed without augmentation to complete and pushed spontaneous with coaching to deliver.   At 23:54 a viable and healthy female was delivered via vaginally  (Presentation: vertex vertex ; LOA).   Shoulder dystocia: McRoberts maneuver, then posterior arm (left) delivered, <1 min  APGAR: 8 ,9 ; weight pending .    Cord clamping delayed by several minutes then clamped by SNM and cut by family (sister).    Cord pH: Arterial -pending  Placenta intact and spontaneous, bleeding minimal. Placenta had two separate lobes.  Anesthesia: epidural  Episiotomy:  none Lacerations:  none Suture Repair: none Est. Blood Loss (mL):   Mom and baby stable prior to transfer to postpartum.   She plans on breastfeeding.   She is undecided for birth control.    Baby to Couplet care / Skin to Skin.  Juliann Pares, Student-MidWife [employer] 11/24/2020, 12:32 AM   Midwife attestation: I was gloved and present for delivery in its entirety and I agree with the above student's note.  Donette Larry, CNM 1:27 AM

## 2020-08-07 ENCOUNTER — Encounter: Payer: Self-pay | Admitting: Obstetrics and Gynecology

## 2020-08-07 ENCOUNTER — Ambulatory Visit: Payer: Self-pay | Admitting: Licensed Clinical Social Worker

## 2020-08-08 ENCOUNTER — Other Ambulatory Visit: Payer: Self-pay

## 2020-08-08 ENCOUNTER — Ambulatory Visit (INDEPENDENT_AMBULATORY_CARE_PROVIDER_SITE_OTHER): Payer: Self-pay

## 2020-08-08 VITALS — BP 112/75 | HR 90 | Temp 97.7°F | Wt 292.0 lb

## 2020-08-08 DIAGNOSIS — Z3480 Encounter for supervision of other normal pregnancy, unspecified trimester: Secondary | ICD-10-CM

## 2020-08-08 DIAGNOSIS — O99280 Endocrine, nutritional and metabolic diseases complicating pregnancy, unspecified trimester: Secondary | ICD-10-CM

## 2020-08-08 DIAGNOSIS — E0590 Thyrotoxicosis, unspecified without thyrotoxic crisis or storm: Secondary | ICD-10-CM

## 2020-08-08 DIAGNOSIS — Z3A21 21 weeks gestation of pregnancy: Secondary | ICD-10-CM

## 2020-08-08 MED ORDER — LEVOTHYROXINE SODIUM 50 MCG PO TABS: 50.0000 ug | ORAL_TABLET | Freq: Every day | ORAL | 1 refills | Status: DC

## 2020-08-08 NOTE — Progress Notes (Signed)
LOW-RISK PREGNANCY OFFICE VISIT  Patient name: [name] MRN [id number]  Date of birth: 1985-08-15 Chief Complaint:   Routine Prenatal Visit  Subjective:   [name] is a 35 y.o. G5P1011 female at [redacted]w[redacted]d with an Estimated Date of Delivery: 12/17/20 being seen today for ongoing management of a low-risk pregnancy aeb has Encounter for health maintenance examination in adult; Obesity; Family history of DVT; Family history of diabetes mellitus; Anemia, iron deficiency; Anemia, unspecified; Thyroid activity decreased; ADD (attention deficit disorder); and Supervision of other normal pregnancy, antepartum on their problem list.  Patient presents today with complaint of sharp abdominal cramping. She states the pain occurs with certain movement particularly sitting to standing.  She states the pain is bilateral, but only occurs unilaterally.  She states the pain lasts about 5-10 minutes and is relieved with applying pressure.  Patient states she drinks plenty of water throughout the day.  Patient endorses fetal movement.  She denies vaginal concerns including abnormal discharge, leaking of fluid, and bleeding.  Contractions: Not present. Vag. Bleeding: None.  Movement: Present.  Reviewed past medical,surgical, social, obstetrical and family history as well as problem list, medications and allergies.  Objective   Vitals:   08/08/20 0855  BP: 112/75  Pulse: 90  Temp: 97.7 F (36.5 C)  Weight: 292 lb (132.5 kg)  Body mass index is 47.85 kg/m.  Total Weight Gain:42 lb (19.1 kg)         Physical Examination:   General appearance: Well appearing, and in no distress  Mental status: Alert, oriented to person, place, and time  Skin: Warm & dry  Cardiovascular: Normal heart rate noted  Respiratory: Normal respiratory effort, no distress  Abdomen: Soft, gravid, nontender, AGA with Fundal height of Fundal Height: 21 cm  Pelvic: Cervical exam deferred           Extremities: Edema: None  Fetal  Status: Fetal Heart Rate (bpm): 140  Movement: Present   No results found for this or any previous visit (from the past 24 hour(s)).  Assessment & Plan:  LOW-risk pregnancy of a 35 y.o., G3P1011 at [redacted]w[redacted]d with an Estimated Date of Delivery: 12/17/20   1. Supervision of other normal pregnancy, antepartum -Anticipatory guidance for upcoming appts. -Discussed birthing options from waterbirth to primary elective c/s. -Patient states she has received a Medicaid card and was instructed to call office to see if it is active.  -Briefly discussed Covid vaccination in nursing.   2. [redacted] weeks gestation of pregnancy -Doing well overall -Discussed pregnancy complaint of abdominal pain. -Advised usage of maternity belt/band for continued support. -If medicaid is active will plan to send in script, otherwise patient to obtain at home.  3. Hyperthyroidism affecting pregnancy, antepartum -Taking daily -Reports refill needed. -Rx refilled with 90 day supply.   Meds:  Meds ordered this encounter  Medications  . levothyroxine (SYNTHROID) 50 MCG tablet    Sig: Take 1 tablet (50 mcg total) by mouth daily before breakfast.    Dispense:  90 tablet    Refill:  1    Order Specific Question:   Supervising Provider    Answer:   Reva Bores [[id number]]   Labs/procedures today:  Lab Orders  No laboratory test(s) ordered today     Reviewed: Preterm labor symptoms and general obstetric precautions including but not limited to vaginal bleeding, contractions, leaking of fluid and fetal movement were reviewed in detail with the patient.  All questions were answered.  Follow-up: Return in about  4 weeks (around 09/05/2020) for LROB.  No orders of the defined types were placed in this encounter.  Cherre Robins MSN, CNM 08/08/2020

## 2020-08-08 NOTE — Patient Instructions (Signed)
Back Injury Prevention Back injuries can be very painful. They can also be difficult to heal. After having one back injury, you are more likely to have another one again. It is important to learn how to avoid injuring or re-injuring your back. The following tips can help you to prevent a back injury. What actions can I take to prevent back injuries? Nutrition changes Talk with your health care provider about your overall diet, and especially about foods that strengthen your bones.  Ask your health care provider how much calcium and vitamin D you need each day. These nutrients help to prevent weakening of the bones (osteoporosis). Osteoporosis can cause broken (fractured) bones, which lead to back pain.  Eat foods that are good sources of calcium. These include dairy products, green leafy vegetables, and products that have had calcium added to them (fortified).  Eat foods that are good sources of vitamin D. These include milk and foods that are fortified with vitamin D.  If needed, take supplements and vitamins as directed by your health care provider. Physical fitness Physical fitness strengthens your bones and your muscles. It also increases your balance and strength.  Exercise for 30 minutes per day on most days of the week, or as directed by your health care provider. Make sure to: ? Do aerobic exercises, such as walking, jogging, biking, or swimming. ? Do exercises that increase balance and strength, such as tai chi and yoga. These can decrease your risk of falling and injuring your back. ? Do stretching exercises to help with flexibility. ? Develop strong abdominal muscles. Your abdominal muscles provide a lot of the support that your back needs.  Maintain a healthy weight. This helps to decrease your risk of a back injury. Good posture        Prevent back injuries by developing and maintaining a good posture. To do this successfully:  Sit up and stand up straight. Avoid leaning  forward when you sit or hunching over when you stand.  Choose chairs that have good low-back (lumbar) support.  If you work at a desk, sit close to it so you do not need to lean over. Keep your chin tucked in. Keep your neck drawn back, and keep your elbows bent at a right angle.  Sit high and close to the steering wheel when you drive. Add a lumbar support to your car seat, if needed.  Avoid sitting or standing in one position for very long. Take breaks to get up, stretch, and walk around at least one time every hour. Take breaks every hour if you are driving for long periods of time.  Sleep on your side with your knees slightly bent, or sleep on your back with a pillow under your knees.  Lifting, twisting, and reaching Back injuries are more likely to occur when carrying loads and twisting at the same time. When you bend and lift, or reach for items that are high up in shelves, use positions that put less stress on your back.  Heavy lifting ? Avoid heavy lifting, especially the kind of heavy lifting that is repetitive. If you must do heavy lifting:  Stretch before lifting.  Work slowly.  Rest between lifts.  Use a tool such as a cart or a dolly to move objects.  Make several small trips instead of carrying one heavy load.  Ask for help when you need it, especially when moving big or heavy objects. ? Follow these steps when lifting:  Stand with your feet  shoulder-width apart.  Get as close to the object as you can. Do not try to pick up a heavy object that is far from your body.  Use handles or lifting straps if they are available.  Bend at your knees. Squat down, but keep your heels off the floor.  Keep your shoulders pulled back, your chin tucked in, and your back straight.  Lift the object slowly while you tighten the muscles in your legs, abdomen, and buttocks. Keep the object as close to the center of your body as possible. ? Follow these steps when putting down a  heavy load:  Stand with your feet shoulder-width apart.  Lower the object slowly while you tighten the muscles in your legs, abdomen, and buttocks. Keep the object as close to the center of your body as possible.  Keep your shoulders pulled back, your chin tucked in, and your back straight.  Bend at your knees. Squat down, but keep your heels off the floor.  Use handles or lifting straps if they are available.  Twisting and reaching ? Avoid lifting heavy objects above your waist. ? Do not twist at your waist while you are lifting or carrying a load. If you need to turn, move your feet. ? Do not bend over without bending at your knees. ? Avoid reaching over your head, across a table, or for an object on a high surface.  Other changes   Avoid wet floors and icy ground. Keep sidewalks clear of ice to prevent falls.  Do not sleep on a mattress that is too soft or too hard.  Put heavier objects on shelves at waist level, and put lighter objects on lower or higher shelves.  Find ways to decrease your stress, such as by exercising, getting a massage, or practicing relaxation techniques. Stress can build up in your muscles. Tense muscles are more vulnerable to injury.  Talk with your health care provider if you feel anxious or depressed. These conditions can make back pain worse.  Wear flat heel shoes with cushioned soles.  Use both shoulder straps when carrying a backpack.  Do not use any products that contain nicotine or tobacco, such as cigarettes and e-cigarettes. If you need help quitting, ask your health care provider. Summary  Back injuries can be very painful and difficult to heal.  You can prevent injuring or re-injuring your back by making nutrition changes, working on being physically fit, developing a good posture, and lifting heavy objects in a safe way.  Making other changes can also help to prevent back injuries. These include eating a healthy diet, exercising  regularly and maintaining a healthy weight. This information is not intended to replace advice given to you by your health care provider. Make sure you discuss any questions you have with your health care provider. Document Revised: 04/11/2019 Document Reviewed: 09/03/2017 Elsevier Patient Education  2020 [employer].

## 2020-08-13 ENCOUNTER — Ambulatory Visit (INDEPENDENT_AMBULATORY_CARE_PROVIDER_SITE_OTHER): Payer: Self-pay | Admitting: Licensed Clinical Social Worker

## 2020-08-13 DIAGNOSIS — Z3A00 Weeks of gestation of pregnancy not specified: Secondary | ICD-10-CM

## 2020-08-13 DIAGNOSIS — F419 Anxiety disorder, unspecified: Secondary | ICD-10-CM

## 2020-08-13 DIAGNOSIS — O99340 Other mental disorders complicating pregnancy, unspecified trimester: Secondary | ICD-10-CM

## 2020-08-13 NOTE — BH Specialist Note (Addendum)
Integrated Behavioral Health Follow Up   MRN: [id number] Name: Tyreona Panjwani  Number of Integrated Behavioral Health Clinician visits: 3/6 Session Start time: 1:15pm  Session End time: 2:00pm Total time: 45 mins via mychart   PT: HOME  PROVIDER: [facility]   I connected with Billie Ruddy and n/a by Telephone  (Video is Caregility application) and verified that I am speaking with the correct person using two identifiers.Discussed confidentiality: Yes   I discussed the limitations of telemedicine and the availability of in person appointments.  Discussed there is a possibility of technology failure and discussed alternative modes of communication if that failure occurs.  I discussed that engaging in this telemedicine visit, they consent to the provision of behavioral healthcare and the services will be billed under their insurance.  Patient and/or legal guardian expressed understanding and consented to Telemedicine visit: Yes   Types of Service: General Behavioral Integrated Care   Interpretor:no  Interpretor Name and Language: none  Subjective: Markita Stcharles is a 35 y.o. female accompanied by n/a Patient was referred by R. Arita Miss CNM for anxiety Patient reports the following symptoms/concerns: anxiety Duration of problem: 3 months; Severity of problem: mild  Objective: Mood: Good  and Affect: appropriate  Risk of harm to self or others: No plan to harm self or others   Life Context: Family and Social: Lives with son in [place]  School/Work: Nurse Self-Care: n/a Life Changes: New Pregnancy  Patient and/or Family's Strengths/Protective Factors: Concrete supports in place   Goals Addressed: Patient will: 1.  Reduce symptoms of: anxiety  2.  Increase knowledge and/or ability of: healthy habits   3.  Demonstrate ability to: Increase healthy adjustments of current life circumstance   Progress towards Goals: Ongoing   Interventions: Interventions utilized:  Supportive  counseling  Standardized Assessments completed:  Flowsheet Row Initial Prenatal from 06/04/2020 in [facility]  PHQ-9 Total Score 10      Assessment: Patient currently experiencing anxiety affecting pregnancy  Patient may benefit from integrated behavioral health   Plan: 1. Follow up with behavioral health clinician on : 3 weeks via mychart  1. Behavioral recommendations: Learn emotional and behavioral triggers at contribute to anxiety, create boundaries to prevent burnout and communicate needs to father of baby 2.  3. Referral(s): n/a 4. "From scale of 1-10, how likely are you to follow plan?":   Gwyndolyn Saxon, LCSW

## 2020-09-04 ENCOUNTER — Ambulatory Visit (INDEPENDENT_AMBULATORY_CARE_PROVIDER_SITE_OTHER): Payer: Self-pay | Admitting: Obstetrics and Gynecology

## 2020-09-04 ENCOUNTER — Encounter: Payer: Self-pay | Admitting: Obstetrics and Gynecology

## 2020-09-04 ENCOUNTER — Other Ambulatory Visit: Payer: Self-pay

## 2020-09-04 VITALS — BP 126/88 | HR 102 | Temp 97.7°F | Wt 291.4 lb

## 2020-09-04 DIAGNOSIS — Z3480 Encounter for supervision of other normal pregnancy, unspecified trimester: Secondary | ICD-10-CM

## 2020-09-04 DIAGNOSIS — Z3A25 25 weeks gestation of pregnancy: Secondary | ICD-10-CM

## 2020-09-04 DIAGNOSIS — O99210 Obesity complicating pregnancy, unspecified trimester: Secondary | ICD-10-CM

## 2020-09-04 MED ORDER — ASPIRIN 81 MG PO CHEW: 81.0000 mg | CHEWABLE_TABLET | Freq: Every day | ORAL | 6 refills | Status: DC

## 2020-09-04 NOTE — Progress Notes (Signed)
   LOW-RISK PREGNANCY OFFICE VISIT Patient name: [name] MRN [id number]  Date of birth: 05-10-1986 Chief Complaint:   Routine Prenatal Visit  History of Present Illness:   [name] is a 35 y.o. G46P1011 female at [redacted]w[redacted]d with an Estimated Date of Delivery: 12/17/20 being seen today for ongoing management of a low-risk pregnancy.  Today she reports no complaints. She reports she lost weight since her last visit, because she has "cut out eating red meat." Contractions: Not present. Vag. Bleeding: None.  Movement: Present. denies leaking of fluid. Review of Systems:   Pertinent items are noted in HPI Denies abnormal vaginal discharge w/ itching/odor/irritation, headaches, visual changes, shortness of breath, chest pain, abdominal pain, severe nausea/vomiting, or problems with urination or bowel movements unless otherwise stated above. Pertinent History Reviewed:  Reviewed past medical,surgical, social, obstetrical and family history.  Reviewed problem list, medications and allergies. Physical Assessment:   Vitals:   09/04/20 0954 09/04/20 1316  BP: 131/90 126/88  Pulse: (!) 102   Temp: 97.7 F (36.5 C)   Weight: 291 lb 6.4 oz (132.2 kg)   Body mass index is 47.75 kg/m.        Physical Examination:   General appearance: Well appearing, and in no distress  Mental status: Alert, oriented to person, place, and time  Skin: Warm & dry  Cardiovascular: Normal heart rate noted  Respiratory: Normal respiratory effort, no distress  Abdomen: Soft, gravid, nontender  Pelvic: Cervical exam deferred         Extremities: Edema: None  Fetal Status: Fetal Heart Rate (bpm): 140 Fundal Height: 35 cm Movement: Present    No results found for this or any previous visit (from the past 24 hour(s)).  Assessment & Plan:  1) Low-risk pregnancy G3P1011 at [redacted]w[redacted]d with an Estimated Date of Delivery: 12/17/20   2) Supervision of other normal pregnancy, antepartum  - Will continue to monitor BP  3) Obesity  in pregnancy - FH = S>D - [place] [facility] OB FOLLOW UP,  - Rx for aspirin 81 MG chewable tablet,  - Referral to Nutrition and Diabetes Services  4) [redacted] weeks gestation of pregnancy    Meds:  Meds ordered this encounter  Medications  . aspirin 81 MG chewable tablet    Sig: Chew 1 tablet (81 mg total) by mouth daily.    Dispense:  30 tablet    Refill:  6    Order Specific Question:   Supervising Provider    Answer:   Reva Bores [[id number]]   Labs/procedures today: none  Plan:  Continue routine obstetrical care   Reviewed: Preterm labor symptoms and general obstetric precautions including but not limited to vaginal bleeding, contractions, leaking of fluid and fetal movement were reviewed in detail with the patient.  All questions were answered. Has home bp cuff. Check bp weekly, let us know if >140/90.   Follow-up: Return in about 4 weeks (around 10/02/2020) for Return OB 2hr GTT.  Orders Placed This Encounter  Procedures  . [place] [facility] OB FOLLOW UP  . Referral to Nutrition and Diabetes Services   Raelyn Mora MSN, [place] 09/04/2020 10:22 AM

## 2020-09-09 ENCOUNTER — Ambulatory Visit: Payer: Self-pay | Admitting: Registered"

## 2020-09-09 ENCOUNTER — Encounter: Payer: Self-pay | Attending: Family Medicine | Admitting: Registered"

## 2020-09-09 ENCOUNTER — Other Ambulatory Visit: Payer: Self-pay

## 2020-09-09 DIAGNOSIS — O99210 Obesity complicating pregnancy, unspecified trimester: Secondary | ICD-10-CM

## 2020-09-09 DIAGNOSIS — O2600 Excessive weight gain in pregnancy, unspecified trimester: Secondary | ICD-10-CM | POA: Insufficient documentation

## 2020-09-09 DIAGNOSIS — Z3A00 Weeks of gestation of pregnancy not specified: Secondary | ICD-10-CM | POA: Insufficient documentation

## 2020-09-09 NOTE — Progress Notes (Signed)
Medical Nutrition Therapy  Appointment Start time:  0930  Appointment End time:  1015  Primary concerns today: Eating Healthy  Referral diagnosis: O26.00 (ICD-10-CM) - Abnormal weight gain in pregnancy Preferred learning style: no preference indicated Learning readiness: ready   NUTRITION ASSESSMENT   Anthropometrics  Wt Readings from Last 3 Encounters:  09/04/20 291 lb 6.4 oz (132.2 kg)  08/08/20 292 lb (132.5 kg)  07/03/20 287 lb 3.2 oz (130.3 kg)     Clinical Medical Hx: Thyroid activity decreased, iron deficiency anemia Medications: aspirin, prenatal vitamin Labs: T3 Uptake Ratio 10L; A1c 5.6 (3 mo ago); 5.8% (5 yrs ago)  Lifestyle & Dietary Hx Patient states she finds that when she reduces red meat she loses weight. Pt states she takes an iron supplement sometimes due to history of iron deficiency.   Pt states she is motivated to eat well not only for this pregnancy but also for long-term health of her herself and her children.  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  Estimated daily fluid intake: >80 oz Supplements: none Sleep: 8 hrs but not restful sleep, up 4-5x/night to urinate Stress / self-care: not assessed Current average weekly physical activity: not assessed  24-Hr Dietary Recall First Meal: skip OR cereal Snack: none Second Meal: 2 baked egg roles (mother cooked for her) Snack: none Third Meal: hamburger helper made with [place], salad with [place] dressing Snack: 1-2 snack bags of chips. Beverages: 4-5 bottles water with flavor added, juice sometimes, 1-2 c ginger ale (diet and regular)  Estimated Energy Needs Calories: ~2100  NUTRITION DIAGNOSIS  NI-5.8.5 Inadeqate fiber intake As related to limited vegetable, whole grain intake.  As evidenced by dietary recall, constipation, goals for adding more vegetables.   NUTRITION INTERVENTION  Nutrition education (E-1) on the following topics:   . Role of sleep in stress management . Mindful eating . Timing of evening meal . Role and sources of fiber  Handouts Provided Include   MyPlate for Moms  AND MNT for Iron deficiency anemia  List of iron rich foods  Building a Balanced Meal worksheets ([employer])  Learning Style & Readiness for Change Teaching method utilized: Visual & Auditory  Demonstrated degree of understanding via: Teach Back  Barriers to learning/adherence to lifestyle change: none  Goals Established by Pt . Continue having downtime before bed but turn off TV earlier and find other relaxing activities such as yoga, other gentle movement, avoiding social media . Drink less water in the evening to reduce nighttime urination . Consider eating more mindfully, chewing food well . Continue to add more vegetables to your meals   MONITORING & EVALUATION Dietary intake, weekly physical activity, and weight as needed.  Next Steps  Patient is to work on established goals and call for follow-up appointment of needed or desired.

## 2020-09-12 ENCOUNTER — Other Ambulatory Visit: Payer: Self-pay

## 2020-09-17 ENCOUNTER — Ambulatory Visit: Payer: Self-pay | Attending: Obstetrics and Gynecology

## 2020-09-17 ENCOUNTER — Ambulatory Visit: Payer: Self-pay

## 2020-09-22 ENCOUNTER — Other Ambulatory Visit: Payer: Self-pay

## 2020-09-29 ENCOUNTER — Other Ambulatory Visit (INDEPENDENT_AMBULATORY_CARE_PROVIDER_SITE_OTHER): Payer: Self-pay | Admitting: *Deleted

## 2020-09-29 ENCOUNTER — Other Ambulatory Visit: Payer: Self-pay

## 2020-09-29 DIAGNOSIS — Z3480 Encounter for supervision of other normal pregnancy, unspecified trimester: Secondary | ICD-10-CM

## 2020-09-29 NOTE — Progress Notes (Signed)
    Patient in clinic for 28 week labs.   Martin, Tamika L, RN  

## 2020-09-30 ENCOUNTER — Encounter: Payer: Self-pay | Admitting: Obstetrics and Gynecology

## 2020-09-30 ENCOUNTER — Telehealth: Payer: Self-pay | Admitting: *Deleted

## 2020-09-30 ENCOUNTER — Other Ambulatory Visit: Payer: Self-pay | Admitting: Obstetrics and Gynecology

## 2020-09-30 DIAGNOSIS — D509 Iron deficiency anemia, unspecified: Secondary | ICD-10-CM

## 2020-09-30 DIAGNOSIS — O24419 Gestational diabetes mellitus in pregnancy, unspecified control: Secondary | ICD-10-CM | POA: Insufficient documentation

## 2020-09-30 DIAGNOSIS — O24410 Gestational diabetes mellitus in pregnancy, diet controlled: Secondary | ICD-10-CM

## 2020-09-30 LAB — CBC
Hematocrit: 30.5 % — ABNORMAL LOW (ref 34.0–46.6)
Hemoglobin: 9.9 g/dL — ABNORMAL LOW (ref 11.1–15.9)
MCH: 28.4 pg (ref 26.6–33.0)
MCHC: 32.5 g/dL (ref 31.5–35.7)
MCV: 88 fL (ref 79–97)
Platelets: 337 10*3/uL (ref 150–450)
RBC: 3.48 x10E6/uL — ABNORMAL LOW (ref 3.77–5.28)
RDW: 13.5 % (ref 11.7–15.4)
WBC: 7.2 10*3/uL (ref 3.4–10.8)

## 2020-09-30 LAB — HIV ANTIBODY (ROUTINE TESTING W REFLEX): HIV Screen 4th Generation wRfx: NONREACTIVE

## 2020-09-30 LAB — GLUCOSE TOLERANCE, 2 HOURS W/ 1HR
Glucose, 1 hour: 172 mg/dL (ref 65–179)
Glucose, 2 hour: 159 mg/dL — ABNORMAL HIGH (ref 65–152)
Glucose, Fasting: 90 mg/dL (ref 65–91)

## 2020-09-30 LAB — RPR: RPR Ser Ql: NONREACTIVE

## 2020-09-30 MED ORDER — ASCORBIC ACID 500 MG PO TABS: ORAL_TABLET | ORAL | 3 refills | Status: DC

## 2020-09-30 MED ORDER — FERROUS SULFATE 325 (65 FE) MG PO TABS: ORAL_TABLET | ORAL | 3 refills | Status: DC

## 2020-09-30 MED ORDER — ACCU-CHEK SOFTCLIX LANCETS MISC: 12 refills | Status: DC

## 2020-09-30 MED ORDER — ACCU-CHEK GUIDE W/DEVICE KIT: 1.0000 | PACK | Freq: Four times a day (QID) | 0 refills | Status: DC

## 2020-09-30 NOTE — Telephone Encounter (Addendum)
Patient called with concerns regarding her recent lab results. Patient received her lab results via Mychart. Patient was not happy about that and wished she could have received a call from the provider. 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 She asked what happens with other patients and how they receive their results. Patient stated she has a lot of questions and concerns because she transferred to our practice for the water birth. I apologized to the patient on how she received her results. Told patient it does depend on the provider how and when a patient gets their results. I did explain to her what the next steps would be regarding being gestational diabetic. She asked if she needed to transfer to another practice since this would be considered a high risk pregnancy. Informed patient she did not need to transfer out unless her diabetes is not managed well, meaning by diet or oral medication. She would be transferred out only if she has to start on insulin. The nurse did tell her about our other clinics if she chose to transfer her care. Nurse once again apologized for how things were handled. Patient understood. ROB appointment rescheduled for 10/02/20 instead of 10/08/20. Patient stated she has a lot of questions for the provider at her next visit.   Clovis Pu, RN

## 2020-10-01 ENCOUNTER — Other Ambulatory Visit: Payer: Self-pay | Admitting: *Deleted

## 2020-10-01 DIAGNOSIS — O24410 Gestational diabetes mellitus in pregnancy, diet controlled: Secondary | ICD-10-CM

## 2020-10-01 MED ORDER — ACCU-CHEK GUIDE VI STRP: ORAL_STRIP | 12 refills | Status: DC

## 2020-10-02 ENCOUNTER — Encounter: Payer: Self-pay | Admitting: Obstetrics and Gynecology

## 2020-10-02 ENCOUNTER — Other Ambulatory Visit: Payer: Self-pay

## 2020-10-02 ENCOUNTER — Ambulatory Visit (INDEPENDENT_AMBULATORY_CARE_PROVIDER_SITE_OTHER): Payer: 59 | Admitting: Obstetrics and Gynecology

## 2020-10-02 VITALS — BP 108/73 | HR 102 | Temp 97.6°F | Wt 294.6 lb

## 2020-10-02 DIAGNOSIS — O0993 Supervision of high risk pregnancy, unspecified, third trimester: Secondary | ICD-10-CM

## 2020-10-02 DIAGNOSIS — O99210 Obesity complicating pregnancy, unspecified trimester: Secondary | ICD-10-CM

## 2020-10-02 DIAGNOSIS — O24410 Gestational diabetes mellitus in pregnancy, diet controlled: Secondary | ICD-10-CM

## 2020-10-02 NOTE — Progress Notes (Signed)
HIGH-RISK PREGNANCY OFFICE VISIT Patient name: [name] MRN [id number]  Date of birth: Aug 08, 1985 Chief Complaint:   Routine Prenatal Visit  History of Present Illness:   [name] is a 35 y.o. G43P1011 female at [redacted]w[redacted]d with an Estimated Date of Delivery: 12/17/20 being seen today for ongoing management of a high-risk pregnancy complicated by A1DM newly diagnosed Today she reports feeling uneasy about her dx of GDMA1 and not receiving a call from CNM about dx; only received a MyChart message. 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 She reports she feels "very weak and almost to the point of fainting around 0300 or 0400 and it happens quite often." She also wants to talk about the possibility of having an elective PCS on 12/17/2020, because that is the brithday of a special relative who died last year. Contractions: Irregular. Vag. Bleeding: None.  Movement: Present. denies leaking of fluid.  Review of Systems:   Pertinent items are noted in HPI Denies abnormal vaginal discharge w/ itching/odor/irritation, headaches, visual changes, shortness of breath, chest pain, abdominal pain, severe nausea/vomiting, or problems with urination or bowel movements unless otherwise stated above. Pertinent History Reviewed:  Reviewed past medical,surgical, social, obstetrical and family history.  Reviewed problem list, medications and allergies. Physical Assessment:   Vitals:   10/02/20 1620  BP: 108/73  Pulse: (!) 102  Temp: 97.6 F (36.4 C)  Weight: 294 lb 9.6 oz (133.6 kg)  Body mass index is 48.28 kg/m.           Physical Examination:   General appearance: alert, well appearing, and in no distress, oriented to person, place, and time and overweight  Mental status: alert, oriented to person, place, and time, normal mood, behavior, speech, dress, motor activity, and thought processes  Skin: warm & dry   Extremities: Edema: None    Cardiovascular: normal heart rate  noted  Respiratory: normal respiratory effort, no distress  Abdomen: gravid, soft, non-tender  Pelvic: Cervical exam deferred         Fetal Status: Fetal Heart Rate (bpm): 146 Fundal Height: 40 cm Movement: Present    Fetal Surveillance Testing today: none   No results found for this or any previous visit (from the past 24 hour(s)).  Assessment & Plan:  1) High-risk pregnancy G3P1011 at [redacted]w[redacted]d with an Estimated Date of Delivery: 12/17/20   2) Supervision of high risk pregnancy in third trimester - Now has [employer] - no longer in [employer] program  3) Obesity in pregnancy  - [place] MFM OB LIMITED - Taking bASA daily  4) Diet controlled gestational diabetes mellitus (GDM), antepartum  - [place] MFM OB DETAILED - Scheduled for diabetic educator appt - Checking blood sugars, but unsure of what to eat  - Advised on making different food choices - Premier protein packing lunch, eliminating as many carbs as possible, keeping diet log and blood sugar log - Advised that waterbirth will not be an option for her if she has uncontrolled BS and weight becomes an issue. Patient states she is not even sure she wants a waterbirth now. She thinks she may want to just have a CS and "not risk the complications that could arise win delivery." - Review of Blood Sugar Levels: FBS range = 89-90 mg/dL  2 hr PP range = has not checked today and does not have a log. Advised to start checking 4 times daily, keeping a log and bringing it to every visit.  Meds: No orders of the defined types were placed in this encounter.   Labs/procedures today: none  Treatment Plan:  Meet with diabetic educator, MFM Detailed U/S   Reviewed: Preterm labor symptoms and general obstetric precautions including but not limited to vaginal bleeding, contractions, leaking of fluid and fetal movement were reviewed in detail with the patient.  All questions were answered. Has home bp cuff. Check bp weekly, let us know if >140/90.    Follow-up: Return in about 2 weeks (around 10/16/2020) for Return OB visit.  Orders Placed This Encounter  Procedures  . [place] MFM OB DETAIL +14 WK   Raelyn Mora MSN, [place] 10/02/2020 5:01 PM

## 2020-10-02 NOTE — Patient Instructions (Signed)
Diabetes Mellitus and Nutrition, Adult When you have diabetes, or diabetes mellitus, it is very important to have healthy eating habits because your blood sugar (glucose) levels are greatly affected by what you eat and drink. Eating healthy foods in the right amounts, at about the same times every day, can help you:  Control your blood glucose.  Lower your risk of heart disease.  Improve your blood pressure.  Reach or maintain a healthy weight. What can affect my meal plan? Every person with diabetes is different, and each person has different needs for a meal plan. Your health care provider may recommend that you work with a dietitian to make a meal plan that is best for you. Your meal plan may vary depending on factors such as:  The calories you need.  The medicines you take.  Your weight.  Your blood glucose, blood pressure, and cholesterol levels.  Your activity level.  Other health conditions you have, such as heart or kidney disease. How do carbohydrates affect me? Carbohydrates, also called carbs, affect your blood glucose level more than any other type of food. Eating carbs naturally raises the amount of glucose in your blood. Carb counting is a method for keeping track of how many carbs you eat. Counting carbs is important to keep your blood glucose at a healthy level, especially if you use insulin or take certain oral diabetes medicines. It is important to know how many carbs you can safely have in each meal. This is different for every person. Your dietitian can help you calculate how many carbs you should have at each meal and for each snack. How does alcohol affect me? Alcohol can cause a sudden decrease in blood glucose (hypoglycemia), especially if you use insulin or take certain oral diabetes medicines. Hypoglycemia can be a life-threatening condition. Symptoms of hypoglycemia, such as sleepiness, dizziness, and confusion, are similar to symptoms of having too much  alcohol.  Do not drink alcohol if: ? Your health care provider tells you not to drink. ? You are pregnant, may be pregnant, or are planning to become pregnant.  If you drink alcohol: ? Do not drink on an empty stomach. ? Limit how much you use to:  0-1 drink a day for women.  0-2 drinks a day for men. ? Be aware of how much alcohol is in your drink. In the [place] one drink equals one 12 oz bottle of beer (355 mL), one 5 oz glass of wine (148 mL), or one 1 oz glass of hard liquor (44 mL). ? Keep yourself hydrated with water, diet soda, or unsweetened iced tea.  Keep in mind that regular soda, juice, and other mixers may contain a lot of sugar and must be counted as carbs. What are tips for following this plan? Reading food labels  Start by checking the serving size on the "Nutrition Facts" label of packaged foods and drinks. The amount of calories, carbs, fats, and other nutrients listed on the label is based on one serving of the item. Many items contain more than one serving per package.  Check the total grams (g) of carbs in one serving. You can calculate the number of servings of carbs in one serving by dividing the total carbs by 15. For example, if a food has 30 g of total carbs per serving, it would be equal to 2 servings of carbs.  Check the number of grams (g) of saturated fats and trans fats in one serving. Choose foods that have   a low amount or none of these fats.  Check the number of milligrams (mg) of salt (sodium) in one serving. Most people should limit total sodium intake to less than 2,300 mg per day.  Always check the nutrition information of foods labeled as "low-fat" or "nonfat." These foods may be higher in added sugar or refined carbs and should be avoided.  Talk to your dietitian to identify your daily goals for nutrients listed on the label. Shopping  Avoid buying canned, pre-made, or processed foods. These foods tend to be high in fat, sodium, and added  sugar.  Shop around the outside edge of the grocery store. This is where you will most often find fresh fruits and vegetables, bulk grains, fresh meats, and fresh dairy. Cooking  Use low-heat cooking methods, such as baking, instead of high-heat cooking methods like deep frying.  Cook using healthy oils, such as olive, canola, or sunflower oil.  Avoid cooking with butter, cream, or high-fat meats. Meal planning  Eat meals and snacks regularly, preferably at the same times every day. Avoid going long periods of time without eating.  Eat foods that are high in fiber, such as fresh fruits, vegetables, beans, and whole grains. Talk with your dietitian about how many servings of carbs you can eat at each meal.  Eat 4-6 oz (112-168 g) of lean protein each day, such as lean meat, chicken, fish, eggs, or tofu. One ounce (oz) of lean protein is equal to: ? 1 oz (28 g) of meat, chicken, or fish. ? 1 egg. ?  cup (62 g) of tofu.  Eat some foods each day that contain healthy fats, such as avocado, nuts, seeds, and fish.   What foods should I eat? Fruits Berries. Apples. Oranges. Peaches. Apricots. Plums. Grapes. Mango. Papaya. Pomegranate. Kiwi. Cherries. Vegetables Lettuce. Spinach. Leafy greens, including kale, chard, collard greens, and mustard greens. Beets. Cauliflower. Cabbage. Broccoli. Carrots. Green beans. Tomatoes. Peppers. Onions. Cucumbers. Brussels sprouts. Grains Whole grains, such as whole-wheat or whole-grain bread, crackers, tortillas, cereal, and pasta. Unsweetened oatmeal. Quinoa. Brown or wild rice. Meats and other proteins Seafood. Poultry without skin. Lean cuts of poultry and beef. Tofu. Nuts. Seeds. Dairy Low-fat or fat-free dairy products such as milk, yogurt, and cheese. The items listed above may not be a complete list of foods and beverages you can eat. Contact a dietitian for more information. What foods should I avoid? Fruits Fruits canned with  syrup. Vegetables Canned vegetables. Frozen vegetables with butter or cream sauce. Grains Refined white flour and flour products such as bread, pasta, snack foods, and cereals. Avoid all processed foods. Meats and other proteins Fatty cuts of meat. Poultry with skin. Breaded or fried meats. Processed meat. Avoid saturated fats. Dairy Full-fat yogurt, cheese, or milk. Beverages Sweetened drinks, such as soda or iced tea. The items listed above may not be a complete list of foods and beverages you should avoid. Contact a dietitian for more information. Questions to ask a health care provider  Do I need to meet with a diabetes educator?  Do I need to meet with a dietitian?  What number can I call if I have questions?  When are the best times to check my blood glucose? Where to find more information:  American Diabetes Association: diabetes.org  Academy of Nutrition and Dietetics: www.eatright.org  National Institute of Diabetes and Digestive and Kidney Diseases: www.niddk.nih.gov  Association of Diabetes Care and Education Specialists: www.diabeteseducator.org Summary  It is important to have healthy eating   habits because your blood sugar (glucose) levels are greatly affected by what you eat and drink.  A healthy meal plan will help you control your blood glucose and maintain a healthy lifestyle.  Your health care provider may recommend that you work with a dietitian to make a meal plan that is best for you.  Keep in mind that carbohydrates (carbs) and alcohol have immediate effects on your blood glucose levels. It is important to count carbs and to use alcohol carefully. This information is not intended to replace advice given to you by your health care provider. Make sure you discuss any questions you have with your health care provider. Document Revised: 06/26/2019 Document Reviewed: 06/26/2019 Elsevier Patient Education  2021 Elsevier Inc.  

## 2020-10-08 ENCOUNTER — Encounter: Payer: Self-pay | Admitting: Obstetrics and Gynecology

## 2020-10-09 ENCOUNTER — Ambulatory Visit: Payer: 59 | Admitting: Registered"

## 2020-10-09 ENCOUNTER — Other Ambulatory Visit: Payer: Self-pay

## 2020-10-09 ENCOUNTER — Encounter: Payer: 59 | Attending: Family Medicine | Admitting: Registered"

## 2020-10-09 DIAGNOSIS — Z3A00 Weeks of gestation of pregnancy not specified: Secondary | ICD-10-CM | POA: Insufficient documentation

## 2020-10-09 DIAGNOSIS — O2600 Excessive weight gain in pregnancy, unspecified trimester: Secondary | ICD-10-CM | POA: Insufficient documentation

## 2020-10-09 DIAGNOSIS — O24419 Gestational diabetes mellitus in pregnancy, unspecified control: Secondary | ICD-10-CM

## 2020-10-09 NOTE — Progress Notes (Signed)
Patient was seen on 10/09/20 for Gestational Diabetes self-management. EDD 12/17/20; [redacted]w[redacted]d . Patient states no history of GDM. Diet history obtained. Patient eats variety of all food groups. Beverages include water, lemonade, Trop 50.    Patient reports A1c ~5.8% 1 yr ago and made changes to diet and lifestlye, lost a lot of weight. Pt states she was not planning pregnancy and didn't think she could get pregnancy because had previous tried for 2 years without conceiving. Pt reports PCOS symptoms including irregular periods, facial hair. Pt states [place] not show cysts on ovaries so was not diagnosed with PCOS.  The following learning objectives were met by the patient :   States the definition of Gestational Diabetes  States why dietary management is important in controlling blood glucose  Describes the effects of carbohydrates on blood glucose levels  Demonstrates ability to create a balanced meal plan  Demonstrates carbohydrate counting   States when to check blood glucose levels  Demonstrates proper blood glucose monitoring techniques  States the effect of stress and exercise on blood glucose levels  States the importance of limiting caffeine and abstaining from alcohol and smoking  Plan:  Aim for 3 Carbohydrate Choices per meal (45 grams) +/- 1 either way  Aim for 1-2 Carbohydrate Choices per snack Begin reading food labels for Total Carbohydrate of foods If OK with your MD, consider  increasing your activity level by walking, Arm Chair Exercises or other activity daily as tolerated Begin checking Blood Glucose before breakfast and 2 hours after first bite of breakfast, lunch and dinner as directed by MD  Bring Log Book/Sheet and meter to every medical appointment  Baby Scripts: (BS 2.0 not capable of glucose management at this time.) Patient to record blood sugar on glucose log sheet  Take medication if directed by MD  Patient already has a meter, is testing pre breakfast and 2  hours after each meal.  Patient reported CBGs mostly WNL: FBS: 80-90 mg/dL; 2 hrs post meal avg 90s, only once above normal at 130 mg/dL  Patient instructed to monitor glucose levels: FBS: 60 - 95 mg/dl 2 hour: <120 mg/dl  Patient received the following handouts:  Nutrition Diabetes and Pregnancy  Carbohydrate Counting List  Blood glucose Log Sheet  Patient will be seen for follow-up as needed.

## 2020-10-10 ENCOUNTER — Encounter: Payer: Self-pay | Admitting: *Deleted

## 2020-10-10 ENCOUNTER — Ambulatory Visit: Payer: 59 | Attending: Obstetrics and Gynecology

## 2020-10-10 ENCOUNTER — Ambulatory Visit: Payer: 59 | Admitting: *Deleted

## 2020-10-10 DIAGNOSIS — Z3480 Encounter for supervision of other normal pregnancy, unspecified trimester: Secondary | ICD-10-CM | POA: Diagnosis present

## 2020-10-10 DIAGNOSIS — O99210 Obesity complicating pregnancy, unspecified trimester: Secondary | ICD-10-CM | POA: Diagnosis present

## 2020-10-10 DIAGNOSIS — O24410 Gestational diabetes mellitus in pregnancy, diet controlled: Secondary | ICD-10-CM | POA: Insufficient documentation

## 2020-10-10 DIAGNOSIS — O0993 Supervision of high risk pregnancy, unspecified, third trimester: Secondary | ICD-10-CM | POA: Diagnosis not present

## 2020-10-13 ENCOUNTER — Telehealth (INDEPENDENT_AMBULATORY_CARE_PROVIDER_SITE_OTHER): Payer: 59 | Admitting: Obstetrics and Gynecology

## 2020-10-13 ENCOUNTER — Other Ambulatory Visit: Payer: Self-pay

## 2020-10-13 VITALS — Wt 290.0 lb

## 2020-10-13 DIAGNOSIS — O99013 Anemia complicating pregnancy, third trimester: Secondary | ICD-10-CM

## 2020-10-13 DIAGNOSIS — Z833 Family history of diabetes mellitus: Secondary | ICD-10-CM

## 2020-10-13 DIAGNOSIS — Z3A30 30 weeks gestation of pregnancy: Secondary | ICD-10-CM

## 2020-10-13 DIAGNOSIS — O99213 Obesity complicating pregnancy, third trimester: Secondary | ICD-10-CM

## 2020-10-13 DIAGNOSIS — O99891 Other specified diseases and conditions complicating pregnancy: Secondary | ICD-10-CM

## 2020-10-13 DIAGNOSIS — Z3480 Encounter for supervision of other normal pregnancy, unspecified trimester: Secondary | ICD-10-CM

## 2020-10-13 DIAGNOSIS — Z8249 Family history of ischemic heart disease and other diseases of the circulatory system: Secondary | ICD-10-CM

## 2020-10-13 DIAGNOSIS — O24410 Gestational diabetes mellitus in pregnancy, diet controlled: Secondary | ICD-10-CM

## 2020-10-13 DIAGNOSIS — E039 Hypothyroidism, unspecified: Secondary | ICD-10-CM

## 2020-10-13 DIAGNOSIS — O99210 Obesity complicating pregnancy, unspecified trimester: Secondary | ICD-10-CM

## 2020-10-13 DIAGNOSIS — O99283 Endocrine, nutritional and metabolic diseases complicating pregnancy, third trimester: Secondary | ICD-10-CM

## 2020-10-13 DIAGNOSIS — F909 Attention-deficit hyperactivity disorder, unspecified type: Secondary | ICD-10-CM

## 2020-10-13 DIAGNOSIS — D509 Iron deficiency anemia, unspecified: Secondary | ICD-10-CM

## 2020-10-13 NOTE — Patient Instructions (Addendum)
Gestational Diabetes Mellitus, Self-Care When you have gestational diabetes mellitus, you must make sure your blood sugar (glucose) stays at a healthy level. What are the risks? If you do not get treated for this condition, it may cause problems for you and your unborn baby. For the mother  Giving birth to the baby early.  Having problems during labor and when giving birth.  Needing surgery to give birth to the baby (cesarean delivery).  Having problems with blood pressure.  Getting this form of diabetes again when pregnant.  Getting type 2 diabetes in the future. For the baby  Low blood sugar.  Bigger body size than is normal.  Breathing problems. How to monitor blood sugar Check your blood sugar every day while you are pregnant. Check it as often as told by your doctor. To do this: 1. Wash your hands with soap and water for at least 20 seconds. 2. Prick the side of your finger (not the tip) with the lancet. Use a different finger each time. 3. Gently rub the finger until a small drop of blood appears. 4. Follow instructions that come with your meter for: ? Putting in the test strip. ? Putting blood on the strip. ? Getting the result. 5. Write down your result and any notes. In general, your blood sugar levels should be:  95 mg/dL (5.3 mmol/L) if you have not eaten.  140 mg/dL (7.8 mmol/L) 1 hour after a meal.  120 mg/dL (6.7 mmol/L) 2 hours after a meal.   Follow these instructions at home: Medicines  Take over-the-counter and prescription medicines only as told by your doctor.  If your doctor prescribed insulin or other diabetes medicines: ? Take them every day. ? Do not run out of insulin or other medicines. Plan ahead so you always have them. Eating and drinking  Follow instructions from your doctor about eating or drinking restrictions.  See a food expert (dietician) to help you create an eating plan that helps control your blood sugar. The foods in this  plan will include: ? Low-fat proteins. ? Dried beans, nuts, and whole grain breads, cereals, or pasta. ? Fresh fruits and vegetables. ? Low-fat dairy products. ? Healthy fats.  Eat healthy snacks between healthy meals.  Drink enough fluid to keep your pee (urine) pale yellow.  Keep track of carbs that you eat. To do this: ? Read food labels. ? Learn the serving sizes of foods.  Follow your sick day plan when you cannot eat or drink normally. Make this plan with your doctor so it is ready to use.   Activity  Do exercises as told by your doctor.  Exercise for 30 or more minutes a day, or as much as your doctor recommends. To help you control blood sugar levels after a meal: ? Do 10 minutes of exercise after each meal. ? Start this exercise 30 minutes after the meal.  Talk with your doctor before you start a new exercise. Your doctor may tell you to change your insulin, other medicines, or food. Lifestyle  Do not drink alcohol.  Do not use any products that contain nicotine or tobacco, such as cigarettes, e-cigarettes, and chewing tobacco. If you need help quitting, ask your doctor.  Learn how to deal with stress. If you need help with this, ask your doctor. Body care  Stay up to date with your shots (vaccines).  Take good care of your teeth. To do this: ? Brush your teeth and gums two times a day. ?   Floss one or more times a day. ? Go to the dentist one or more times every 6 months.  Stay at a healthy weight while you are pregnant. General instructions  Ask your doctor about risks of high blood pressure in pregnancy.  Share your diabetes care plan with: ? Your work or school. ? People you live with.  Check your pee for ketones: ? When you are sick. ? As told by your doctor.  Carry a card or wear a bracelet that says you have diabetes.  Keep all follow-up visits. Care after giving birth  Have your blood sugar checked 4-12 weeks after you give birth.  Get  checked for diabetes one or more times every 3 years or as told. Where to find more information  American Diabetes Association (ADA): diabetes.org  Association of Diabetes Care & Education Specialists (ADCES): diabeteseducator.org  Centers for Disease Control and Prevention (CDC): TonerPromos.no  American Pregnancy Association: americanpregnancy.org  U.S. Department of Agriculture MyPlate: WrestlingReporter.dk Contact a doctor if:  Your blood sugar is above your target for two tests in a row.  You have a fever.  You are sick for 2 days or more and do not get better.  You have either of these problems for more than 6 hours: ? You vomit every time you eat or drink. ? You have watery poop (diarrhea). Get help right away if:  You cannot think clearly.  You have trouble breathing.  You have moderate or high ketones in your pee.  Blood or abnormal fluid starts to come out of your vagina.  You feel your baby is not moving as usual.  You start having early contractions. You may feel your belly tighten.  You have a very bad headache. These symptoms may be an emergency. Get help right away. Call your local emergency services (911 in the U.S.).  Do not wait to see if the symptoms will go away.  Do not drive yourself to the hospital. Summary  Check your blood sugar (glucose) while you are pregnant. Check it as often as told by your doctor.  Take your insulin and diabetes medicines as told.  Have your blood sugar checked 4-12 weeks after you give birth.  Keep all follow-up visits. This information is not intended to replace advice given to you by your health care provider. Make sure you discuss any questions you have with your health care provider. Document Revised: 12/24/2019 Document Reviewed: 12/24/2019 Elsevier Patient Education  2021 Elsevier Inc. AREA PEDIATRIC/FAMILY PRACTICE PHYSICIANS  [place]outheast [place] ([address]) . [facility] Melodie Bouillon, MD;  Lum Babe, MD; Sheffield Slider, MD; Leveda Anna, MD; McDiarmid, MD; Jerene Bears, MD; Jennette Kettle, MD; Gwendolyn Grant, MD o [address] [place] [place], [place] [address] o [phone] o Mon-Fri 8:30-12:30, 1:30-5:00 o Providers come to see babies at [facility] o Accepting Medicaid . [facility] o Limited providers who accept newborns: Docia Chuck, MD; Kateri Plummer, MD; Paulino Rily, MD o [address], [place], [place] [address] o [phone] o Mon-Fri 8:00-5:30 o Babies seen by providers at [facility] o Does NOT accept Medicaid o Please call early in hospitalization for appointment (limited availability)  . [facility] Fatima Sanger, MD o [address] [place], [place] [address] o [phone] o Mon, Tue, Thur, Fri 8:30-5:00, Wed 10:00-7:00 (closed 1-2pm) o Babies seen by [facility] providers o Accepting Medicaid . Donnie Coffin - Pediatrician Fae Pippin, MD o [address], [place], [place] [address] o [phone] o Mon-Fri  8:30-5:00, Sat 8:30-12:00 o Provider comes to see babies at [facility] o Accepting Medicaid o Must have been referred from current patients or contacted office prior to delivery . [facility]) Leotis Pain, MD; Ave Filter, MD; Luna Fuse, MD; Kennedy Bucker, MD; Konrad Dolores, MD; Kathlene November, MD; Jenne Campus, MD; Lubertha South, MD; Wynetta Emery, MD; Duffy Rhody, MD; Gerre Couch, NP; Shirl Harris, NP o [address] [place], [place], [place] [address] o [phone] o Mon, Tue, Thur, Fri 8:30-5:30, Wed 9:30-5:30, Sat 8:30-12:30 o Babies seen by [facility] providers o Accepting Medicaid o Only accepting infants of first-time parents or siblings of current patients [facility] discharge coordinator will make follow-up appointment . Cyril Mourning o [address] [place], [place]  [address] o [phone]   Fax - [phone] . [facility] o [address], [place], [place]  [address] o Phone  - [phone]   Fax - [phone] . Lucio Edward o [address], [place], [place]  [address] o [phone]  [place] [phone]) . [facility] Jorge Mandril, MD; Alita Chyle, MD; Princella Ion, MD; MD; Earlene Plater, MD; Jamesetta Orleans, MD; Alvera Novel, MD; Clarene Duke, MD; Rana Snare, MD; Carmon Ginsberg, MD; Alinda Money, MD; Hosie Poisson, MD; Mayford Knife, MD o [address] [place], [place] [address] o [phone] o Mon-Fri 8:30-5:00 (extended evenings Mon-Thur as needed), Sat-Sun 10:00-1:00 o Providers come to see babies at [facility] o Accepting Medicaid for families of first-time babies and families with all children in the household age 52 and under. Must register with office prior to making appointment (M-F only). [facility] Odella Aquas, NP; Lynelle Doctor, MD; Susann Givens, MD; [place], [place] o [address] [place], [place] [address] o [phone] o Mon-Fri 8:00-5:00 o Babies seen by providers at [facility] o Does NOT accept Medicaid/Commercial Insurance Only . [facility] [place] [facility])  Suzette Battiest, MD; Zachery Dauer, MD; Stefan Church, MD; Sabino Dick, MD; Quitman Livings, MD; Farris Has, MD; Gaynell Face, MD; Betha Loa, MD; Colon Flattery, MD; Clifton James, MD o [address] [place] [place], [place] [address] o [phone] o Mon-Fri 8:30-5:30, Sat (Oct.-Mar.) 9:00-1:00 o Babies seen by providers at [facility] o Accepting [facility] [phone]) . [facility] Dimitri, MD; Sheliah Hatch, MD o [address], [place], [place] [address] o [phone] o Mon-Fri 8:30-5:00, Sat 8:30-12:00 o Providers come to see babies at [facility] o Does NOT accept Medicaid . [facility] Cindy Hazy, [place]; [place], MD; [place], [place]; Wynelle Link, MD; Azucena Cecil, MD o [address], [place], [place] [address] o [phone] o Mon-Fri 8:00-5:00 o Babies seen by providers at [facility] o Does NOT accept Medicaid o Only  accepting babies of parents who are patients o Please call early in hospitalization for appointment (limited availability) . [facility] Lamar Benes, MD; Abran Cantor, MD; Early Osmond, MD; Cherre Huger, NP; Hyacinth Meeker, MD; Dwan Bolt, MD; Jarold Motto, NP; Dario Guardian, MD; Talmage Nap, MD; Maisie Fus, MD; Pricilla Holm, MD; Tama High, MD o [address] [place], [place], [place] [address] o [phone] o Mon-Fri 8:00-5:00, Sat 9:00-12:00 o Providers come to see babies at [facility] o Does NOT accept [facility] [phone]) . [facility] at [facility] o Limited providers accepting new patients: Drema Pry, NP; Delena Serve, PA o [address] [place], [place] [address] o [phone] o Mon-Fri 8:00-5:00 o Babies seen by providers at [facility] o Does NOT accept Medicaid o Only accepting babies of parents who are patients  o Please call early in hospitalization for appointment (limited availability) . [facility] Luan Pulling, MD; Nash Dimmer, MD o [address] [place] [place] [place], [place] [address] o [phone] (press 1 to schedule appointment) o Mon-Fri 8:00-5:00 o Providers come to see babies at [facility] o Does NOT accept Medicaid . [facility] Cristino Martes, MD o [address], [place], [place] [address] o [phone] o Mon-Fri 8:30-5:00 (lunch 12:30-1:00), extended hours by appointment only Wed 5:00-6:30 o Babies seen by [facility] providers o Accepting Medicaid . [facility] Abbot, MD; [place], MD; Hassan Rowan, MD o [address] [place], [place], [place] [address] o [phone] o Mon-Fri 8:00-5:00 o Babies seen by [facility] providers o Does NOT accept Medicaid . Nature conservation officer at [place] [address] Elsworth Soho, MD; Durene Cal, MD; [place], DO o [address] [place], [place] [address] o [phone] o Mon-Fri 8:00-5:00 o Babies seen by [facility] providers o Does NOT accept Medicaid . [facility] o [place], [place];  [place], [place]; [place], NP; Avis Epley, MD; Vonna Kotyk, MD; Clance Boll, MD; Stevphen Rochester, NP; Arvilla Market, NP; Ann Maki, NP; Otis Dials, NP; Vaughan Basta, MD; [place], MD o [address] [place], [place] [address] o [phone] o Mon-Fri 8:30-5:00, Sat 10:00-1:00 o Providers come to see babies at [facility] o Does NOT accept Medicaid o Free prenatal information session Tuesdays at 4:45pm . [facility] Luna Kitchens, MD; [place], [place]; [place], [place]; [place], [place] o [address] [place] [address] o [phone] o Mon-Fri 7:30-5:30 o Babies seen by [facility] providers . [facility] o [address], [place], [place]  [address] o [phone]   Fax - [phone]  [place] [phone] & [phone]) . [facility] Alphonsa Overall, MD o [address] [place], [place] [address] o [phone] o Mon-Thur 8:00-6:00 o Providers come to see babies at [facility] o Accepting Medicaid . [facility] Zenon Mayo, NP; Cyndia Bent, MD; [place], [place]; [place], [place] o [address] [place], [place] [address] o [phone] o Mon-Thur 7:30-7:30, Fri 7:30-4:30 o Babies seen by [facility] providers o Accepting Medicaid . [facility] Cheryle Horsfall, MD; Janene Harvey, NP; Vonita Moss, MD o [address], [place], [place] [address] o [phone] o Mon-Fri 8:30-5:00, Sat 8:30-12:00 o Providers come to see babies at [facility] o Accepting Medicaid o Must have "Meet & Greet" appointment at office prior to delivery . [facility] [place] ([facility]) Llana Aliment, MD; Earlene Plater, MD; Lucretia Roers, MD o [address], [place], [place] [address] o [phone] o Mon-Wed 8:00-6:00, Thur-Fri 8:00-5:00, Sat 9:00-12:00 o Providers come to see babies at [facility] o Does NOT accept Medicaid o Only accepting siblings of current patients . [facility]  o [address], [place], [place]  [address] o [phone]   Fax - [phone] . [facility] at [facility] o [phone] [address], [place], [place]  [address] o [phone]   Fax - [phone]  [place] [phone] & [phone]) . Nature conservation officer at [facility] o Raymond, DO; Blaine, DO o [address] [place], [place] [address] o [phone] o Mon-Fri 7:00-5:00 o Babies seen by [facility] providers o Does NOT accept Medicaid . [facility] Ellis Savage, MD; [place], [place]; [place], [place] o [address], [place], [place] [address] o [phone] o Mon-Fri 8:00-5:00 o Babies seen by [facility] providers o Accepting Medicaid .  [facility] - [address] Franne Forts, MD; [place], [place]; [place], NP; [place], [place] o [address] [place], [place], [place] [address] o [phone] o Mon-Fri 8:00-5:00 o Babies seen by providers at [facility] o Accepting [facility] [phone]) . [facility] [place], [place] o [address] [place], [place] [address] o [phone] o Mon-Fri 8:00-5:00 o Babies seen by [facility] providers o Does NOT accept Medicaid o Limited availability, please call early in hospitalization to schedule follow-up . [facility] Jolee Ewing, PA; Eddie Candle, MD; [place], MD; [place], [place]; Constance Goltz, MD; [place], [place] o [facility] [address], [place], [place] [address] o [phone] o Mon-Fri 8:30-5:00, Sat 9:00-12:00 o Babies seen by providers at [facility] o Accepting Medicaid o Please register online then schedule online or call office o [email or web address] . [facility]) Samuella Bruin, NP; Lucianne Muss, MD; Lanier Clam, PA o [address], [place], [place] [address] o [phone] o Mon-Fri 8:00-5:00 o Babies seen by  providers at [facility] o Accepting Medicaid . [facility] ([facility]) Sharin Mons, MD; Reed Breech, NP; Shelva Majestic, MD o [address], [place], [place] [address] o [phone] o Mon-Fri 8:00-5:30, Sat&Sun by appointment (phones open at 8:30) o Babies seen by [facility] providers o Accepting Medicaid o Must be a first-time baby or sibling of current patient . [facility] [address], [place], [place]  [address] o [phone]   Fax - [phone]  [place] [phone] & [phone]) . [facility] o [place], [place]; [place], [place]; Dimple Casey, MD; [place], [place]; Carolyne Fiscal, MD o [address], [place], [place] [address] o [phone] o Mon-Thur 8:00-7:00, Fri 8:00-5:00, Sat 8:00-12:00, Sun 9:00-12:00 o Babies seen by [facility] providers o Accepting Medicaid . [facility] at [facility], MD; Gaynell Face, MD; [facility] [place], MD o [address], [place], [place] [address] o [phone] o Mon-Thur 8:00-5:00 o Babies seen by providers at [facility] o Accepting Medicaid . [facility] [place] Gwenlyn Saran, MD; [place], MD; Madilyn Fireman, MD; Melvyn Neth, MD; List, MD; Lazarus Salines, MD; Gaynell Face, MD; Berneda Rose, MD; Flora Lipps, MD; Beryl Meager, MD; Luther Redo, MD; Lavonia Drafts, MD; Kellie Simmering, MD o [address] [place] [place], [place] [address] o [phone] o Mon-Fri 8:00-5:30, Sat (Oct.-Mar.) 9:00-1:00 o Babies seen by providers at [facility] o Accepting Medicaid o Must fill out new patient packet, available online at [email or web address] . [facility] [place] [facility] [facility] at [facility]) Simone Curia, NP; Tiburcio Pea, NP; Tresa Endo, NP; Whitney Post, MD; [place], [place]; Hennie Duos, MD; Wynne Dust, MD; Kavin Leech, NP o [address], [place], [place] [address] o [phone] o Mon-Thur 8:00-5:30, Fri  8:00-5:00 o Babies seen by providers at [facility] o Accepting [facility] [phone]) . [facility] o [place], [place]; [place], MD; Tanya Nones, MD; [place], [place] o [address] [place], [place] [address] o [phone] o Mon-Fri 8:00-5:00 o Babies seen by providers at [facility] o Accepting [facility] [phone]) . [facility] o Horatio, DO; Lenise Arena, MD; [place], [place] o [address], [place], [place] [address] o [phone] o Mon-Fri 8:00-5:00 o Babies seen by providers at [facility] o Does NOT accept Medicaid o Limited appointment availability, please call early in hospitalization  . Nature conservation officerLeBauer [facility] at [facility] o [place]ne[place]Kuned[place], DO; [place], MD o [address], [place], [place] [address] o [phone] o Mon-Fri 8:00-5:00 o Babies seen by [facility] providers o Does NOT accept Medicaid . [facility] Lorrine Kino Cameron, MD; Ninetta LightsMacDonald, MD; [place], [place]; [place], MD o [address], [place], [place] [address] o [phone] o Mon-Fri 8:00-5:00 o After hours clinic [facility]([address] [place], [place] [address]) [phone] Mon-Fri 5:00-8:00, Sat 12:00-6:00, Sun 10:00-4:00 o Babies seen by [facility] providers o Accepting Medicaid . [facility] o [address], [place], [place]  [address] o [phone]   Fax - [phone]  [place] [phone]) . Nature conservation officerLeBauer HealthCare at [facility] o Andy, MD o [address] [place], [place], [place] [address] o [phone] o Mon-Fri 8:00-5:00 o Babies seen by [facility] providers o Does NOT accept Medicaid . [facility] [place] [facility] [place]) Tomi Likenso Eksir, MD o [address], [place], [place] [address] o [phone] o Mon-Thur 8:00-7:00, Fri 8:00-5:00, Sat 8:00-12:00 o Babies seen by providers at [facility] o Accepting Medicaid - but does  not have vaccinations in office (must be received elsewhere) o Limited availability, please call early in hospitalization  [place] ([phone]) . [facility]  o Wyvonne Lenzharlene Flemming, MD o [address], [place] [address] o [phone]  Fax [phone]

## 2020-10-13 NOTE — Progress Notes (Signed)
OBSTETRICS PRENATAL VIRTUAL VISIT ENCOUNTER NOTE  Provider location: [facility]   I connected with [name] on 10/13/20 at  8:15 AM EDT by MyChart Video Encounter at home and verified that I am speaking with the correct person using two identifiers.   I discussed the limitations, risks, security and privacy concerns of performing an evaluation and management service virtually and the availability of in person appointments. I also discussed with the patient that there may be a patient responsible charge related to this service. The patient expressed understanding and agreed to proceed. Subjective:  [name] is a 35 y.o. G3P1011 at [redacted]w[redacted]d being seen today for ongoing prenatal care.  She is currently monitored for the following issues for this high-risk pregnancy and has Encounter for health maintenance examination in adult; Obesity in pregnancy; Family history of DVT; Family history of diabetes mellitus; Anemia, iron deficiency; Anemia, unspecified; Thyroid activity decreased; ADD (attention deficit disorder); Supervision of other normal pregnancy, antepartum; Gestational diabetes; and [redacted] weeks gestation of pregnancy on their problem list.  Patient reports vaginal discharge.  Contractions: Irritability. Vag. Bleeding: None.  Movement: Present. Denies any leaking of fluid.   Pt is very concerned about her diet controlled gestational diabetes.  At this point she is no longer interested in a waterbirth.  Long discussion regarding trial of labor versus primary c section for delivery.  Relayed to patient that generally a primary c section is not performed unless excessive macrosomia is diagnosed or there is some other medical issue.  I let patient know I would poll the group for a response.  The following portions of the patient's history were reviewed and updated as appropriate: allergies, current medications, past family history, past medical history, past  social history, past surgical history and problem list.   Objective:   Vitals:   10/13/20 0823  Weight: 290 lb (131.5 kg)    Fetal Status:     Movement: Present     General:  Alert, oriented and cooperative. Patient is in no acute distress.  Respiratory: Normal respiratory effort, no problems with respiration noted  Mental Status: Normal mood and affect. Normal behavior. Normal judgment and thought content.  Rest of physical exam deferred due to type of encounter  Imaging: [place] MFM OB DETAIL +14 WK  Result Date: 10/10/2020 ----------------------------------------------------------------------  OBSTETRICS REPORT                       (Signed Final 10/10/2020 06:05 pm) ---------------------------------------------------------------------- Patient Info  ID #:       [id number]                          D.O.B.:  1986-05-08 (34 yrs)  Name:       [name]                       Visit Date: 10/10/2020 01:49 pm ---------------------------------------------------------------------- Performed By  Attending:        Noralee Space MD        Secondary Phy.:   Dia Sitter  DAWSON CNM  Performed By:     Jovancia Adrien        Address:          [address]                                                             [place], [place]                                                             [address]  Referred By:      [facility]        Location[facility]         [facility] ---------------------------------------------------------------------- Orders  #  Description                           Code        Ordered By  1  [place] MFM OB DETAIL +14 WK               76811.01     Raelyn Mora ----------------------------------------------------------------------  #  Order #                     Accession #                Episode #  1  [id number] Mealy            [id number]  [id number] ---------------------------------------------------------------------- Indications  Hypothyroid (Synthyroid)                       O99.280 E03.9  Obesity complicating pregnancy, third          O99.213  trimester  Gestational diabetes in pregnancy, diet        O24.410  controlled  Encounter for antenatal screening for          Z36.3  malformations  [redacted] weeks gestation of pregnancy                Z3A.30 ---------------------------------------------------------------------- Fetal Evaluation  Num Of Fetuses:         1  Fetal Heart Rate(bpm):  147  Cardiac Activity:       Observed  Presentation:           Cephalic  Placenta:               Anterior Fundal  P. Cord Insertion:      Visualized  Amniotic Fluid  AFI FV:      Within normal limits  AFI Sum(cm)     %Tile       Largest Pocket(cm)  19.35           74          7.14  RUQ(cm)       RLQ(cm)       LUQ(cm)        LLQ(cm)  4.06          2.97          7.14           5.18 ---------------------------------------------------------------------- Biometry  BPD:      79.6  mm     G. Age:  32w 0d         85  %    CI:        74.24   %    70 - 86                                                          FL/HC:      20.4   %    19.2 - 21.4  HC:      293.3  mm     G. Age:  32w 3d         74  %    HC/AC:      0.94        0.99 - 1.21  AC:      312.2  mm     G. Age:  35w 1d       > 99  %    FL/BPD:     75.3   %    71 - 87  FL:       59.9  mm     G. Age:  31w 1d         61  %    FL/AC:      19.2   %    20 - 24  HUM:      54.2  mm     G. Age:  31w 4d         75  %  LV:        2.3  mm  Est. FW:    2203  gm    4 lb 14 oz    > 99  % ---------------------------------------------------------------------- OB History  Gravidity:    3         Term:   1  TOP:          1         Living:  1 ---------------------------------------------------------------------- Gestational Age  U/S Today:     32w 5d                                        EDD:   11/30/20  Best:          30w 2d     Det. By:  Previous Ultrasound      EDD:   12/17/20                                      (04/21/20) ---------------------------------------------------------------------- Anatomy  Cranium:               Appears normal         LVOT:                   Not well visualized  Cavum:                 Appears normal         Aortic Arch:            Not well visualized  Ventricles:            Appears normal         Ductal Arch:            Not well visualized  Choroid Plexus:        Appears normal         Diaphragm:              Appears normal  Cerebellum:            Appears normal         Stomach:                Appears normal, left                                                                        sided  Posterior Fossa:       Appears normal         Abdomen:                Appears normal  Nuchal Fold:           Not applicable (>20    Abdominal Wall:         Appears nml (cord                         wks GA)  insert, abd wall)  Face:                  Not well visualized    Cord Vessels:           Not well visualized  Lips:                  Appears normal         Kidneys:                Appear normal  Palate:                Appears normal         Bladder:                Appears normal  Thoracic:              Appears normal         Spine:                  Not well visualized  Heart:                 Not well visualized    Upper Extremities:      Not well visualized  RVOT:                  Not well visualized    Lower Extremities:      Not well visualized  Other:  Technically difficult due to maternal habitus and fetal position.          Technicallly difficult due to advanced GA and maternal habitus. ---------------------------------------------------------------------- Impression  G3 P1.   Patient is here for ultrasound evaluation.  She had  fetal anatomical survey performed elsewhere and report is  that it was normal.  On cell free fetal DNA screening, she had low risk for fetal  aneuploidies.  She has a recent diagnosis of gestational diabetes that is  reportedly well controlled on diet.  On ultrasound, the estimated fetal weight is at the 99th  percentile.  Amniotic fluid is normal and good fetal activity  seen.  Fetal anatomical survey appears normal but very  limited by advanced gestational age.  Maternal obesity imposed limitations on the resolution of  images. ---------------------------------------------------------------------- Recommendations  -An appointment was made for her to return in 4 weeks for  fetal growth assessment.  --If patient requires insulin or oral hypoglycemics, we  recommend weekly antenatal testing till delivery. ----------------------------------------------------------------------                  Noralee Space, MD Electronically Signed Final Report   10/10/2020 06:05 pm ----------------------------------------------------------------------   Assessment and Plan:  Pregnancy: G3P1011 at [redacted]w[redacted]d 1. Supervision of other normal pregnancy, antepartum Transfer to [facility]  2. [redacted] weeks gestation of pregnancy   3. Diet controlled gestational diabetes mellitus (GDM) in third trimester Growth scan already scheduled, continue blood sugar monitoring  4. Obesity in pregnancy   5. Family history of DVT No personal history  6. Family history of diabetes mellitus   7. Iron deficiency anemia, unspecified iron deficiency anemia type   8. Hypothyroidism, unspecified type Last labs showed TSH and T4 normal, check again at next visit  Preterm labor symptoms and general obstetric precautions including but not limited to vaginal bleeding, contractions, leaking of fluid and fetal movement were reviewed in detail with the patient. I discussed the assessment  and treatment plan with the patient. The patient was  provided an opportunity to ask questions and all were answered. The patient agreed with the plan and demonstrated an understanding of the instructions. The patient was advised to call back or seek an in-person office evaluation/go to MAU at [facility] for any urgent or concerning symptoms. Please refer to After Visit Summary for other counseling recommendations.   I provided 25 minutes of face-to-face time during this encounter.  Return in about 1 week (around 10/20/2020) for [facility], in person.  Future Appointments  Date Time Provider Department Center  10/20/2020  8:15 AM Warden Fillers, MD [facility]    Warden Fillers, MD Center for [facility], [facility]

## 2020-10-20 ENCOUNTER — Encounter: Payer: 59 | Admitting: Obstetrics and Gynecology

## 2020-11-05 ENCOUNTER — Encounter (HOSPITAL_COMMUNITY): Payer: Self-pay | Admitting: Obstetrics & Gynecology

## 2020-11-05 ENCOUNTER — Other Ambulatory Visit: Payer: Self-pay

## 2020-11-05 ENCOUNTER — Inpatient Hospital Stay (HOSPITAL_COMMUNITY)
Admission: AD | Admit: 2020-11-05 | Discharge: 2020-11-05 | Disposition: A | Discharge status: Home / Self Care | Payer: 59 | Attending: Obstetrics & Gynecology | Admitting: Obstetrics & Gynecology

## 2020-11-05 DIAGNOSIS — O3663X0 Maternal care for excessive fetal growth, third trimester, not applicable or unspecified: Secondary | ICD-10-CM

## 2020-11-05 DIAGNOSIS — O26893 Other specified pregnancy related conditions, third trimester: Secondary | ICD-10-CM | POA: Insufficient documentation

## 2020-11-05 DIAGNOSIS — E039 Hypothyroidism, unspecified: Secondary | ICD-10-CM

## 2020-11-05 DIAGNOSIS — Z3A34 34 weeks gestation of pregnancy: Secondary | ICD-10-CM

## 2020-11-05 DIAGNOSIS — M549 Dorsalgia, unspecified: Secondary | ICD-10-CM

## 2020-11-05 DIAGNOSIS — O99283 Endocrine, nutritional and metabolic diseases complicating pregnancy, third trimester: Secondary | ICD-10-CM

## 2020-11-05 DIAGNOSIS — O3660X0 Maternal care for excessive fetal growth, unspecified trimester, not applicable or unspecified: Secondary | ICD-10-CM

## 2020-11-05 DIAGNOSIS — O24410 Gestational diabetes mellitus in pregnancy, diet controlled: Secondary | ICD-10-CM

## 2020-11-05 DIAGNOSIS — O99891 Other specified diseases and conditions complicating pregnancy: Secondary | ICD-10-CM | POA: Diagnosis not present

## 2020-11-05 DIAGNOSIS — Z3480 Encounter for supervision of other normal pregnancy, unspecified trimester: Secondary | ICD-10-CM

## 2020-11-05 LAB — URINALYSIS, ROUTINE W REFLEX MICROSCOPIC
Bilirubin Urine: NEGATIVE
Glucose, UA: NEGATIVE mg/dL
Hgb urine dipstick: NEGATIVE
Ketones, ur: NEGATIVE mg/dL
Nitrite: NEGATIVE
Protein, ur: NEGATIVE mg/dL
Specific Gravity, Urine: 1.016 (ref 1.005–1.030)
pH: 6 (ref 5.0–8.0)

## 2020-11-05 NOTE — MAU Provider Note (Addendum)
History    Patient name: [name] CSN: [id number]  Arrival date and time: 11/05/20 1339   Event Date/Time   First Provider Initiated Contact with Patient 11/05/20 1436      Chief Complaint  Patient presents with   Cramping   HPI Chief Complaint: Back Pain History of Present Illness: Patient is a 35yof G3P1011 34w by u/s with PMH of hypothyroidism and A1GDM with chief complaint of back pain.  Back pain She describes it as crampy back pain that started this morning. She rates the pain a 4/10. The pain does not radiate. She has not taken anything for the pain. The pain is worse when she is walking and better when lying down. She has not experienced anything like this before in her pregnancies. She does wear a pregnancy belt. She has +FM. Denies numbness and tingling in her legs. Denies vaginal bleeding, leaking of fluid, vaginal discharge, fever and urinary symptoms.   [redacted] wks gestation, antepartum normal pregnancy She has concerns of not hearing from her OB office to reschedule a prenatal appt. She states she was originally a [employer] patient, but has been switched to [facility]. Within the switch of offices, she has been unable to reschedule an appt and is requesting a doctor to oversee her care at [employer].   A1GDM She is currently diet controlled. No complaints today.  OB History     Gravida  3   Para  1   Term  1   Preterm      AB  1   Living  1      SAB      IAB  1   Ectopic      Multiple      Live Births  1           Past Medical History:  Diagnosis Date   ADD (attention deficit disorder)    Allergy    RHINITIS   BV (bacterial vaginosis)    Eczema    Headache(784.0)    intermittent   Hypothyroidism    Obesity    UTI (lower urinary tract infection)    not frequently    Past Surgical History:  Procedure Laterality Date   WISDOM TOOTH EXTRACTION      Family History  Problem Relation Age of Onset   Fibromyalgia Mother    Lupus Mother     Obesity Mother    Kidney disease Mother    Deep vein thrombosis Father    Hypertension Father    Diabetes Father    Obesity Father    Asthma Sister    Seizures Sister    Hypertension Maternal Grandmother    Heart disease Maternal Grandmother    Kidney disease Maternal Grandmother    Diabetes Maternal Grandmother    Hypertension Maternal Grandfather    Cancer Maternal Grandfather    Alzheimer's disease Paternal Grandmother    Diabetes Paternal Grandfather     Social History   Tobacco Use   Smoking status: Never Smoker   Smokeless tobacco: Never Used  Building services engineer Use: Never used  Substance Use Topics   Alcohol use: Not Currently    Comment: 1-2 nights weekly   Drug use: No    Allergies: No Known Allergies  No medications prior to admission.    Review of Systems  Constitutional: Negative for fever.  Gastrointestinal: Negative for abdominal pain.  Genitourinary: Negative for dysuria, frequency, vaginal bleeding and vaginal discharge.  Musculoskeletal: Positive for back pain.  Neurological: Negative for numbness.   Physical Exam   Blood pressure (!) 116/58, pulse 95, temperature 98 F (36.7 C), temperature source Oral, resp. rate 15, height 5\' 6"  (1.676 m), weight (!) 136.4 kg, last menstrual period 04/18/2020, SpO2 98 %.  Physical Exam Vitals and nursing note reviewed.  Constitutional:      Appearance: She is obese.  HENT:     Head: Normocephalic.  Eyes:     Extraocular Movements: Extraocular movements intact.  Cardiovascular:     Rate and Rhythm: Normal rate and regular rhythm.  Pulmonary:     Effort: Pulmonary effort is normal.  Musculoskeletal:     Thoracic back: No tenderness.     Lumbar back: No tenderness.  Skin:    General: Skin is warm and dry.  Neurological:     General: No focal deficit present.     Mental Status: She is alert.  Psychiatric:        Mood and Affect: Mood normal.        Behavior: Behavior normal.    Dilation:  Closed Effacement (%): 0 Cervical Position: Posterior Exam by:: [email or web address], CNM   MAU Course  Procedures  Results for orders placed or performed during the hospital encounter of 11/05/20 (from the past 72 hour(s))  Urinalysis, Routine w reflex microscopic Urine, Clean Catch     Status: Abnormal   Collection Time: 11/05/20  2:25 PM  Result Value Ref Range   Color, Urine YELLOW YELLOW   APPearance HAZY (A) CLEAR   Specific Gravity, Urine 1.016 1.005 - 1.030   pH 6.0 5.0 - 8.0   Glucose, UA NEGATIVE NEGATIVE mg/dL   Hgb urine dipstick NEGATIVE NEGATIVE   Bilirubin Urine NEGATIVE NEGATIVE   Ketones, ur NEGATIVE NEGATIVE mg/dL   Protein, ur NEGATIVE NEGATIVE mg/dL   Nitrite NEGATIVE NEGATIVE   Leukocytes,Ua TRACE (A) NEGATIVE   RBC / HPF 0-5 0 - 5 RBC/hpf   WBC, UA 0-5 0 - 5 WBC/hpf   Bacteria, UA RARE (A) NONE SEEN   Squamous Epithelial / LPF 0-5 0 - 5   Mucus PRESENT     Comment: Performed at [facility], [address] [place], [place]    MDM Patient is a 35yof [id number] 34w by u/s with PMH of hypothyroidism and A1GDM with chief complaint of back pain. She is lying on her side in the hospital bed. She is experiencing no pain while at the bedside. She has a non laboring cervix. Fetal monitoring is stable, no concerns. Back pain is most likely musculoskeletal in nature or due to dehydration.  Assessment and Plan   1. [redacted] weeks gestation of pregnancy   2. Supervision of other normal pregnancy, antepartum   -- [facility] her [facility] routine [facility] appt  3. Diet controlled gestational diabetes mellitus (GDM) in third trimester   -- Continue diet regimen and follow up with [facility] outpatient  4. Excessive fetal growth affecting management of pregnancy, antepartum, single or unspecified fetus   -- Ordered MFM [id number] to be scheduled as she is due for a 4 week follow up u/s  5. Back pain affecting pregnancy in third trimester   -- Tylenol  PRN for pain  -- Continue to use pregnancy belt  -- Stay hydrated and drink lots of water   -- Apply ice or heat to back where pain is located  -- If LOF or if more than 6 contractions in an hour, told pt  to come back to the MAU.   Patient is agreeable to plan and all questions were answered.   Jeoffrey Massed, PA-Student 11/05/2020, 4:02 PM  CNM attestation:  I have seen and examined this patient; I agree with above documentation in the PA student's note.   Arion Morgan is a 35 y.o. G3P1011 reporting cramping/back pain. +FM, denies LOF, VB, vaginal discharge.  PE: BP (!) 116/58 (BP Location: Right Arm)   Pulse 95   Temp 98 F (36.7 C) (Oral)   Resp 15   Ht 5\' 6"  (1.676 m)   Wt (!) 300 lb 11.2 oz (136.4 kg)   LMP 04/18/2020   SpO2 98%   BMI 48.53 kg/m  Gen: calm comfortable, NAD Resp: normal effort, no distress Abd: gravid  ROS, labs, PMH reviewed NST reactive   MDM: No evidence of preterm labor Pt [facility] due [facility] DM but missed last appt and does not have growth 04/20/2020 scheduled. Growth [place] ordered and message to [facility] to make next appt.    Plan: D/C home Fetal kick counts reinforced, preterm labor precautions Rest/ice/heat/warm bath/Tylenol for pain Continue routine follow up in OB clinic Return to MAU as needed for emergencies  [facility], CNM 8:29 PM

## 2020-11-05 NOTE — MAU Note (Signed)
Presents with c/o abdominal cramping that began @ 0900 this morning.  Denies VB.  Reports cramping worse with activity.  Endorses +FM.  Unsure if LOF, after using restroom has trickling, not wearing pad.

## 2020-11-12 ENCOUNTER — Encounter: Payer: Self-pay | Admitting: *Deleted

## 2020-11-12 ENCOUNTER — Ambulatory Visit: Payer: 59 | Attending: Advanced Practice Midwife

## 2020-11-12 ENCOUNTER — Other Ambulatory Visit: Payer: Self-pay

## 2020-11-12 ENCOUNTER — Ambulatory Visit: Payer: 59 | Admitting: *Deleted

## 2020-11-12 DIAGNOSIS — O99891 Other specified diseases and conditions complicating pregnancy: Secondary | ICD-10-CM | POA: Diagnosis not present

## 2020-11-12 DIAGNOSIS — O3660X0 Maternal care for excessive fetal growth, unspecified trimester, not applicable or unspecified: Secondary | ICD-10-CM

## 2020-11-12 DIAGNOSIS — Z3480 Encounter for supervision of other normal pregnancy, unspecified trimester: Secondary | ICD-10-CM

## 2020-11-12 DIAGNOSIS — Z3A34 34 weeks gestation of pregnancy: Secondary | ICD-10-CM | POA: Diagnosis not present

## 2020-11-12 DIAGNOSIS — O3663X0 Maternal care for excessive fetal growth, third trimester, not applicable or unspecified: Secondary | ICD-10-CM | POA: Diagnosis not present

## 2020-11-12 DIAGNOSIS — O24410 Gestational diabetes mellitus in pregnancy, diet controlled: Secondary | ICD-10-CM | POA: Diagnosis present

## 2020-11-12 DIAGNOSIS — M549 Dorsalgia, unspecified: Secondary | ICD-10-CM | POA: Insufficient documentation

## 2020-11-13 ENCOUNTER — Ambulatory Visit (INDEPENDENT_AMBULATORY_CARE_PROVIDER_SITE_OTHER): Payer: 59 | Admitting: Obstetrics and Gynecology

## 2020-11-13 ENCOUNTER — Other Ambulatory Visit: Payer: Self-pay | Admitting: *Deleted

## 2020-11-13 ENCOUNTER — Encounter: Payer: Self-pay | Admitting: Advanced Practice Midwife

## 2020-11-13 VITALS — BP 115/75 | HR 100 | Wt 299.0 lb

## 2020-11-13 DIAGNOSIS — O99210 Obesity complicating pregnancy, unspecified trimester: Secondary | ICD-10-CM

## 2020-11-13 DIAGNOSIS — O3660X0 Maternal care for excessive fetal growth, unspecified trimester, not applicable or unspecified: Secondary | ICD-10-CM | POA: Insufficient documentation

## 2020-11-13 DIAGNOSIS — Z8249 Family history of ischemic heart disease and other diseases of the circulatory system: Secondary | ICD-10-CM

## 2020-11-13 DIAGNOSIS — Z3A35 35 weeks gestation of pregnancy: Secondary | ICD-10-CM | POA: Insufficient documentation

## 2020-11-13 DIAGNOSIS — Z3480 Encounter for supervision of other normal pregnancy, unspecified trimester: Secondary | ICD-10-CM

## 2020-11-13 DIAGNOSIS — O24410 Gestational diabetes mellitus in pregnancy, diet controlled: Secondary | ICD-10-CM

## 2020-11-13 DIAGNOSIS — Z833 Family history of diabetes mellitus: Secondary | ICD-10-CM

## 2020-11-13 DIAGNOSIS — E039 Hypothyroidism, unspecified: Secondary | ICD-10-CM

## 2020-11-13 LAB — POCT URINALYSIS DIP (DEVICE)
Bilirubin Urine: NEGATIVE
Glucose, UA: NEGATIVE mg/dL
Hgb urine dipstick: NEGATIVE
Ketones, ur: NEGATIVE mg/dL
Leukocytes,Ua: NEGATIVE
Nitrite: NEGATIVE
Protein, ur: NEGATIVE mg/dL
Specific Gravity, Urine: 1.025 (ref 1.005–1.030)
Urobilinogen, UA: 0.2 mg/dL (ref 0.0–1.0)
pH: 7 (ref 5.0–8.0)

## 2020-11-13 LAB — CBC
Hematocrit: 32.1 % — ABNORMAL LOW (ref 34.0–46.6)
Hemoglobin: 10.3 g/dL — ABNORMAL LOW (ref 11.1–15.9)
MCH: 28 pg (ref 26.6–33.0)
MCHC: 32.1 g/dL (ref 31.5–35.7)
MCV: 87 fL (ref 79–97)
Platelets: 379 10*3/uL (ref 150–450)
RBC: 3.68 x10E6/uL — ABNORMAL LOW (ref 3.77–5.28)
RDW: 14.2 % (ref 11.7–15.4)
WBC: 8 10*3/uL (ref 3.4–10.8)

## 2020-11-13 NOTE — Patient Instructions (Signed)

## 2020-11-13 NOTE — Progress Notes (Signed)
   PRENATAL VISIT NOTE  Subjective:  [name] is a 35 y.o. G3P1011 at [redacted]w[redacted]d being seen today for ongoing prenatal care.  She is currently monitored for the following issues for this high-risk pregnancy and has Encounter for health maintenance examination in adult; Obesity in pregnancy; Family history of DVT; Family history of diabetes mellitus; Anemia, iron deficiency; Anemia, unspecified; Thyroid activity decreased; ADD (attention deficit disorder); Supervision of other normal pregnancy, antepartum; Gestational diabetes; [redacted] weeks gestation of pregnancy; Large for gestational age fetus affecting management of mother; and [redacted] weeks gestation of pregnancy on their problem list.  Patient doing well with no acute concerns today. She reports pelvic pressure.  Contractions: Irregular. Vag. Bleeding: None.  Movement: Present. Denies leaking of fluid.   The following portions of the patient's history were reviewed and updated as appropriate: allergies, current medications, past family history, past medical history, past social history, past surgical history and problem list. Problem list updated.  Objective:   Vitals:   11/13/20 0843  BP: 115/75  Pulse: 100  Weight: 299 lb (135.6 kg)    Fetal Status: Fetal Heart Rate (bpm): 133 Fundal Height: 38 cm Movement: Present     General:  Alert, oriented and cooperative. Patient is in no acute distress.  Skin: Skin is warm and dry. No rash noted.   Cardiovascular: Normal heart rate noted  Respiratory: Normal respiratory effort, no problems with respiration noted  Abdomen: Soft, gravid, appropriate for gestational age.  Pain/Pressure: Present     Pelvic: Cervical exam deferred        Extremities: Normal range of motion.  Edema: None  Mental Status:  Normal mood and affect. Normal behavior. Normal judgment and thought content.   Assessment and Plan:  Pregnancy: G3P1011 at [redacted]w[redacted]d  1. Diet controlled gestational diabetes mellitus (GDM) in third  trimester Pt states she lost her glucometer for a few weeks and just started recording values a few days ago.  Moderate control noted.  Glucose sheets given  2. Hypothyroidism, unspecified type   3. Supervision of other normal pregnancy, antepartum Pt concerned about h/h, cbc ordered - CBC  4. Obesity in pregnancy   5. Family history of DVT   6. Family history of diabetes mellitus   7. [redacted] weeks gestation of pregnancy   Preterm labor symptoms and general obstetric precautions including but not limited to vaginal bleeding, contractions, leaking of fluid and fetal movement were reviewed in detail with the patient.  Please refer to After Visit Summary for other counseling recommendations.   Return in about 1 week (around 11/20/2020) for [facility], in person, 36 weeks swabs.   Mariel Aloe, MD Faculty Attending Center for [facility]

## 2020-11-13 NOTE — Progress Notes (Signed)
Pressure on pelvic area and went to hospital last week because she felt as though she was having contractions

## 2020-11-19 ENCOUNTER — Ambulatory Visit: Payer: 59

## 2020-11-19 ENCOUNTER — Ambulatory Visit (INDEPENDENT_AMBULATORY_CARE_PROVIDER_SITE_OTHER): Payer: 59 | Admitting: Obstetrics and Gynecology

## 2020-11-19 ENCOUNTER — Telehealth: Payer: Self-pay | Admitting: Family Medicine

## 2020-11-19 ENCOUNTER — Other Ambulatory Visit: Payer: Self-pay

## 2020-11-19 ENCOUNTER — Ambulatory Visit (INDEPENDENT_AMBULATORY_CARE_PROVIDER_SITE_OTHER): Payer: 59

## 2020-11-19 ENCOUNTER — Other Ambulatory Visit (HOSPITAL_COMMUNITY)
Admission: RE | Admit: 2020-11-19 | Discharge: 2020-11-19 | Disposition: A | Discharge status: Home / Self Care | Payer: 59 | Source: Ambulatory Visit | Attending: Family Medicine | Admitting: Family Medicine

## 2020-11-19 VITALS — BP 115/66 | HR 115 | Wt 302.2 lb

## 2020-11-19 DIAGNOSIS — Z3480 Encounter for supervision of other normal pregnancy, unspecified trimester: Secondary | ICD-10-CM | POA: Diagnosis not present

## 2020-11-19 DIAGNOSIS — O24410 Gestational diabetes mellitus in pregnancy, diet controlled: Secondary | ICD-10-CM

## 2020-11-19 DIAGNOSIS — O99210 Obesity complicating pregnancy, unspecified trimester: Secondary | ICD-10-CM

## 2020-11-19 DIAGNOSIS — E039 Hypothyroidism, unspecified: Secondary | ICD-10-CM

## 2020-11-19 NOTE — Progress Notes (Signed)

## 2020-11-19 NOTE — Telephone Encounter (Signed)
Pt states she is unable to come in on 11-26-20 for NST and dr appt due to work and we do not have any avail spots for 4-28, pt does have NST and Dr visit for  12-04-20. Not sure if pt needs anything else or should just wait for 12-04-20? Pt request call back to verify

## 2020-11-19 NOTE — Progress Notes (Signed)
   PRENATAL VISIT NOTE  Subjective:  [name] is a 35 y.o. G3P1011 at [redacted]w[redacted]d being seen today for ongoing prenatal care.  She is currently monitored for the following issues for this high-risk pregnancy and has Encounter for health maintenance examination in adult; Obesity in pregnancy; Family history of DVT; Family history of diabetes mellitus; Anemia, iron deficiency; Anemia, unspecified; Thyroid activity decreased; ADD (attention deficit disorder); Supervision of other normal pregnancy, antepartum; Gestational diabetes; [redacted] weeks gestation of pregnancy; Large for gestational age fetus affecting management of mother; and [redacted] weeks gestation of pregnancy on their problem list.  Patient doing well with no acute concerns today. She reports occasional contractions.  Contractions: Irregular. Vag. Bleeding: None.  Movement: Present. Denies leaking of fluid.   The following portions of the patient's history were reviewed and updated as appropriate: allergies, current medications, past family history, past medical history, past social history, past surgical history and problem list. Problem list updated.  Objective:   Vitals:   11/19/20 0848  BP: 115/66  Pulse: (!) 115  Weight: (!) 302 lb 3.2 oz (137.1 kg)    Fetal Status: Fetal Heart Rate (bpm): NST   Movement: Present  Presentation: Vertex  General:  Alert, oriented and cooperative. Patient is in no acute distress.  Skin: Skin is warm and dry. No rash noted.   Cardiovascular: Normal heart rate noted  Respiratory: Normal respiratory effort, no problems with respiration noted  Abdomen: Soft, gravid, appropriate for gestational age.  Pain/Pressure: Present     Pelvic: Cervical exam performed Dilation: Fingertip Effacement (%): 40 Station: -3  Extremities: Normal range of motion.     Mental Status:  Normal mood and affect. Normal behavior. Normal judgment and thought content.   Assessment and Plan:  Pregnancy: G3P1011 at [redacted]w[redacted]d  1. Supervision  of other normal pregnancy, antepartum Pt desires elective IOL at 40 weeks, will schedule when she is closer to delivery - Strep Gp B NAA - GC/Chlamydia probe amp ([place])not at [facility]  2. Hypothyroidism, unspecified type   3. Diet controlled gestational diabetes mellitus (GDM) in third trimester 10/10 BPP today  4. Obesity in pregnancy  - US FETAL BPP W/NONSTRESS; Future  Preterm labor symptoms and general obstetric precautions including but not limited to vaginal bleeding, contractions, leaking of fluid and fetal movement were reviewed in detail with the patient.  Please refer to After Visit Summary for other counseling recommendations.   Return in 1 week (on 11/26/2020) for [facility], in person; Add HOB appt on 5/5.   Mariel Aloe, MD Faculty Attending Center for [facility]

## 2020-11-19 NOTE — Patient Instructions (Signed)
Labor Induction Labor induction is when steps are taken to cause a pregnant woman to begin the labor process. Most women go into labor on their own between 37 weeks and 42 weeks of pregnancy. When this does not happen, or when there is a medical need for labor to begin, steps may be taken to induce, or bring on, labor. Labor induction causes a pregnant woman's uterus to contract. It also causes the cervix to soften (ripen), open (dilate), and thin out. Usually, labor is not induced before 39 weeks of pregnancy unless there is a medical reason to do so. When is labor induction considered? Labor induction may be right for you if:  Your pregnancy lasts longer than 41 to 42 weeks.  Your placenta is separating from your uterus (placental abruption).  You have a rupture of membranes and your labor does not begin.  You have health problems, like diabetes or high blood pressure (preeclampsia) during your pregnancy.  Your baby has stopped growing or does not have enough amniotic fluid. Before labor induction begins, your health care provider will consider the following factors:  Your medical condition and the baby's condition.  How many weeks you have been pregnant.  How mature the baby's lungs are.  The condition of your cervix.  The position of the baby.  The size of your birth canal. Tell a health care provider about:  Any allergies you have.  All medicines you are taking, including vitamins, herbs, eye drops, creams, and over-the-counter medicines.  Any problems you or your family members have had with anesthetic medicines.  Any surgeries you have had.  Any blood disorders you have.  Any medical conditions you have. What are the risks? Generally, this is a safe procedure. However, problems may occur, including:  Failed induction.  Changes in fetal heart rate, such as being too high, too low, or irregular (erratic).  Infection in the mother or the baby.  Increased risk of  having a cesarean delivery.  Breaking off (abruption) of the placenta from the uterus. This is rare.  Rupture of the uterus. This is very rare.  Your baby could fail to get enough blood flow or oxygen. This can be life-threatening. When induction is needed for medical reasons, the benefits generally outweigh the risks. What happens during the procedure? During the procedure, your health care provider will use one of these methods to induce labor:  Stripping the membranes. In this method, the amniotic sac tissue is gently separated from the cervix. This causes the following to happen: ? Your cervix stretches, which in turn causes the release of prostaglandins. ? Prostaglandins induce labor and cause the uterus to contract. ? This procedure is often done in an office visit. You will be sent home to wait for contractions to begin.  Prostaglandin medicine. This medicine starts contractions and causes the cervix to dilate and ripen. This can be taken by mouth (orally) or by being inserted into the vagina (suppository).  Inserting a small, thin tube (catheter) with a balloon into the vagina and then expanding the balloon with water to dilate the cervix.  Breaking the water. In this method, a small instrument is used to make a small hole in the amniotic sac. This eventually causes the amniotic sac to break. Contractions should begin within a few hours.  Medicine to trigger or strengthen contractions. This medicine is given through an IV that is inserted into a vein in your arm. This procedure may vary among health care providers and hospitals.     Where to find more information  March of Dimes: www.marchofdimes.org  The American College of Obstetricians and Gynecologists: www.acog.org Summary  Labor induction causes a pregnant woman's uterus to contract. It also causes the cervix to soften (ripen), open (dilate), and thin out.  Labor is usually not induced before 39 weeks of pregnancy unless  there is a medical reason to do so.  When induction is needed for medical reasons, the benefits generally outweigh the risks.  Talk with your health care provider about which methods of labor induction are right for you. This information is not intended to replace advice given to you by your health care provider. Make sure you discuss any questions you have with your health care provider. Document Revised: 05/01/2020 Document Reviewed: 05/01/2020 Elsevier Patient Education  2021 Elsevier Inc.  

## 2020-11-20 LAB — GC/CHLAMYDIA PROBE AMP (~~LOC~~) NOT AT ARMC
Chlamydia: NEGATIVE
Comment: NEGATIVE
Comment: NORMAL
Neisseria Gonorrhea: NEGATIVE

## 2020-11-21 LAB — STREP GP B NAA: Strep Gp B NAA: NEGATIVE

## 2020-11-23 ENCOUNTER — Inpatient Hospital Stay (HOSPITAL_COMMUNITY): Payer: 59 | Admitting: Anesthesiology

## 2020-11-23 ENCOUNTER — Inpatient Hospital Stay (HOSPITAL_COMMUNITY)
Admission: AD | Admit: 2020-11-23 | Discharge: 2020-11-25 | DRG: 805 | Disposition: A | Discharge status: Home / Self Care | Payer: 59 | Attending: Obstetrics & Gynecology | Admitting: Obstetrics & Gynecology

## 2020-11-23 ENCOUNTER — Encounter (HOSPITAL_COMMUNITY): Payer: Self-pay | Admitting: Obstetrics & Gynecology

## 2020-11-23 ENCOUNTER — Other Ambulatory Visit: Payer: Self-pay

## 2020-11-23 DIAGNOSIS — D649 Anemia, unspecified: Secondary | ICD-10-CM | POA: Diagnosis not present

## 2020-11-23 DIAGNOSIS — O99210 Obesity complicating pregnancy, unspecified trimester: Secondary | ICD-10-CM | POA: Diagnosis present

## 2020-11-23 DIAGNOSIS — O24420 Gestational diabetes mellitus in childbirth, diet controlled: Secondary | ICD-10-CM | POA: Diagnosis present

## 2020-11-23 DIAGNOSIS — Z8619 Personal history of other infectious and parasitic diseases: Secondary | ICD-10-CM | POA: Diagnosis present

## 2020-11-23 DIAGNOSIS — E669 Obesity, unspecified: Secondary | ICD-10-CM | POA: Diagnosis not present

## 2020-11-23 DIAGNOSIS — O3663X0 Maternal care for excessive fetal growth, third trimester, not applicable or unspecified: Secondary | ICD-10-CM | POA: Diagnosis present

## 2020-11-23 DIAGNOSIS — O24419 Gestational diabetes mellitus in pregnancy, unspecified control: Secondary | ICD-10-CM | POA: Diagnosis present

## 2020-11-23 DIAGNOSIS — Z23 Encounter for immunization: Secondary | ICD-10-CM

## 2020-11-23 DIAGNOSIS — Z3480 Encounter for supervision of other normal pregnancy, unspecified trimester: Secondary | ICD-10-CM

## 2020-11-23 DIAGNOSIS — O99284 Endocrine, nutritional and metabolic diseases complicating childbirth: Secondary | ICD-10-CM | POA: Diagnosis present

## 2020-11-23 DIAGNOSIS — Z3A36 36 weeks gestation of pregnancy: Secondary | ICD-10-CM

## 2020-11-23 DIAGNOSIS — O3660X0 Maternal care for excessive fetal growth, unspecified trimester, not applicable or unspecified: Secondary | ICD-10-CM | POA: Diagnosis present

## 2020-11-23 DIAGNOSIS — Z20822 Contact with and (suspected) exposure to covid-19: Secondary | ICD-10-CM | POA: Diagnosis present

## 2020-11-23 DIAGNOSIS — O9902 Anemia complicating childbirth: Secondary | ICD-10-CM | POA: Diagnosis not present

## 2020-11-23 DIAGNOSIS — E039 Hypothyroidism, unspecified: Secondary | ICD-10-CM | POA: Diagnosis present

## 2020-11-23 DIAGNOSIS — O99214 Obesity complicating childbirth: Secondary | ICD-10-CM | POA: Diagnosis present

## 2020-11-23 LAB — CBC
HCT: 33 % — ABNORMAL LOW (ref 36.0–46.0)
Hemoglobin: 10.2 g/dL — ABNORMAL LOW (ref 12.0–15.0)
MCH: 27.7 pg (ref 26.0–34.0)
MCHC: 30.9 g/dL (ref 30.0–36.0)
MCV: 89.7 fL (ref 80.0–100.0)
Platelets: 398 10*3/uL (ref 150–400)
RBC: 3.68 MIL/uL — ABNORMAL LOW (ref 3.87–5.11)
RDW: 14.8 % (ref 11.5–15.5)
WBC: 10.3 10*3/uL (ref 4.0–10.5)
nRBC: 0 % (ref 0.0–0.2)

## 2020-11-23 LAB — TYPE AND SCREEN
ABO/RH(D): A POS
Antibody Screen: NEGATIVE

## 2020-11-23 LAB — RESP PANEL BY RT-PCR (FLU A&B, COVID) ARPGX2
Influenza A by PCR: NEGATIVE
Influenza B by PCR: NEGATIVE
SARS Coronavirus 2 by RT PCR: NEGATIVE

## 2020-11-23 LAB — POCT FERN TEST: POCT Fern Test: POSITIVE

## 2020-11-23 LAB — GLUCOSE, CAPILLARY: Glucose-Capillary: 108 mg/dL — ABNORMAL HIGH (ref 70–99)

## 2020-11-23 MED ORDER — ACETAMINOPHEN 325 MG PO TABS: 650.0000 mg | ORAL_TABLET | ORAL | Status: DC | PRN

## 2020-11-23 MED ORDER — OXYTOCIN-SODIUM CHLORIDE 30-0.9 UT/500ML-% IV SOLN: 2.5000 [IU]/h | INTRAVENOUS | Status: DC

## 2020-11-23 MED ORDER — OXYCODONE-ACETAMINOPHEN 5-325 MG PO TABS: 2.0000 | ORAL_TABLET | ORAL | Status: DC | PRN

## 2020-11-23 MED ORDER — LACTATED RINGERS IV SOLN: INTRAVENOUS | Status: DC

## 2020-11-23 MED ORDER — LIDOCAINE HCL (PF) 1 % IJ SOLN: 30.0000 mL | INTRAMUSCULAR | Status: DC | PRN

## 2020-11-23 MED ORDER — OXYTOCIN-SODIUM CHLORIDE 30-0.9 UT/500ML-% IV SOLN
INTRAVENOUS | Status: AC
  Administered 2020-11-24: 333 mL via INTRAVENOUS
  Filled 2020-11-23: qty 500

## 2020-11-23 MED ORDER — LACTATED RINGERS IV SOLN: 500.0000 mL | INTRAVENOUS | Status: DC | PRN

## 2020-11-23 MED ORDER — EPHEDRINE 5 MG/ML INJ: 10.0000 mg | INTRAVENOUS | Status: DC | PRN

## 2020-11-23 MED ORDER — OXYCODONE-ACETAMINOPHEN 5-325 MG PO TABS: 1.0000 | ORAL_TABLET | ORAL | Status: DC | PRN

## 2020-11-23 MED ORDER — FENTANYL-BUPIVACAINE-NACL 0.5-0.125-0.9 MG/250ML-% EP SOLN
EPIDURAL | Status: DC | PRN
  Administered 2020-11-23: 7 mL/h via EPIDURAL

## 2020-11-23 MED ORDER — PHENYLEPHRINE 40 MCG/ML (10ML) SYRINGE FOR IV PUSH (FOR BLOOD PRESSURE SUPPORT)
80.0000 ug | PREFILLED_SYRINGE | INTRAVENOUS | Status: DC | PRN
  Administered 2020-11-23: 80 ug via INTRAVENOUS

## 2020-11-23 MED ORDER — SOD CITRATE-CITRIC ACID 500-334 MG/5ML PO SOLN: 30.0000 mL | ORAL | Status: DC | PRN

## 2020-11-23 MED ORDER — LACTATED RINGERS IV SOLN: 500.0000 mL | Freq: Once | INTRAVENOUS | Status: DC

## 2020-11-23 MED ORDER — OXYTOCIN BOLUS FROM INFUSION: 333.0000 mL | Freq: Once | INTRAVENOUS | Status: AC

## 2020-11-23 MED ORDER — ONDANSETRON HCL 4 MG/2ML IJ SOLN: 4.0000 mg | Freq: Four times a day (QID) | INTRAMUSCULAR | Status: DC | PRN

## 2020-11-23 MED ORDER — FENTANYL-BUPIVACAINE-NACL 0.5-0.125-0.9 MG/250ML-% EP SOLN
12.0000 mL/h | EPIDURAL | Status: DC | PRN
  Filled 2020-11-23: qty 250

## 2020-11-23 MED ORDER — PHENYLEPHRINE 40 MCG/ML (10ML) SYRINGE FOR IV PUSH (FOR BLOOD PRESSURE SUPPORT)
80.0000 ug | PREFILLED_SYRINGE | INTRAVENOUS | Status: DC | PRN
  Filled 2020-11-23: qty 10

## 2020-11-23 MED ORDER — LIDOCAINE HCL (PF) 1 % IJ SOLN
INTRAMUSCULAR | Status: DC | PRN
  Administered 2020-11-23: 5 mL via EPIDURAL

## 2020-11-23 MED ORDER — DIPHENHYDRAMINE HCL 50 MG/ML IJ SOLN
12.5000 mg | INTRAMUSCULAR | Status: DC | PRN
Start: 2020-11-23 — End: 2020-11-24

## 2020-11-23 NOTE — MAU Note (Signed)
Pt presents with complaint of contractions and ROM at 2045, reports good fetal movement

## 2020-11-23 NOTE — H&P (Addendum)
Obstetric History and Physical  [name] is a 35 y.o. G3P1011 with IUP at 51w4dby ultrasound presenting in labor. Patient states she has been having  regular, every 1.5-3 minutes contractions, none vaginal bleeding, ruptured membranes (at 20:45), with active fetal movement.    Patient comfortable with epidural in place. Family at bedside for support.  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   Prenatal labs and studies: ABO, Rh: --/--/A POS (04/24 2212) Antibody: NEG (04/24 2212) Rubella: 4.22 (11/03 0948) RPR: Non Reactive (02/28 0847)  HBsAg: Negative (11/03 0948)  HIV: Non Reactive (02/28 0847)  GYNW:GNFAOZHY/- (04/20 0940) 1 hr Glucola/2 hour: 159 mg/dL Genetic screening normal Anatomy UKoreanormal  Prenatal Transfer Tool  Maternal Diabetes: Yes:  Diabetes Type:  Diet controlled Genetic Screening: Normal Maternal Ultrasounds/Referrals: Normal Fetal Ultrasounds or other Referrals:  Referred to Materal Fetal Medicine  Maternal Substance Abuse:  No Significant Maternal Medications:  Meds include: Other: Synthroid Significant Maternal Lab Results: None  Past Medical History:  Diagnosis Date  . ADD (attention deficit disorder)   . Allergy    RHINITIS  . BV (bacterial vaginosis)   . Eczema   . Headache(784.0)    intermittent  . Hypothyroidism   . Obesity   . UTI (lower urinary tract infection)    not frequently    Past Surgical History:  Procedure Laterality Date  . WISDOM TOOTH EXTRACTION      OB History  Gravida Para Term Preterm AB Living  3 1 1   1 1   SAB IAB Ectopic Multiple Live Births    1     1    # Outcome Date GA Lbr Len/2nd Weight Sex Delivery Anes PTL Lv  3 Current           2  Term 01/16/08 [id number] g M Vag-Spont EPI N LIV  1 IAB 2007            Social History   Socioeconomic History  . Marital status: Single    Spouse name: Not on file  . Number of children: Not on file  . Years of education: Not on file  . Highest education level: Not on file  Occupational History  . Not on file  Tobacco Use  . Smoking status: Never Smoker  . Smokeless tobacco: Never Used  Vaping Use  . Vaping Use: Never used  Substance and Sexual Activity  . Alcohol use: Not Currently    Comment: 1-2 nights weekly  . Drug use: No  . Sexual activity: Yes    Birth control/protection: None  Other Topics Concern  . Not on file  Social History Narrative   Lives at home with [id number].    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   Exercise - walking on the job.     Social Determinants of Health   Financial Resource Strain: Not on file  Food Insecurity: Not on file  Transportation Needs: Not on file  Physical Activity: Not on file  Stress: Not on file  Social Connections: Not on file    Family History  Problem Relation Age of Onset  . Fibromyalgia Mother   .  Lupus Mother   . Obesity Mother   . Kidney disease Mother   . Deep vein thrombosis Father   . Hypertension Father   . Diabetes Father   . Obesity Father   . Asthma Sister   . Seizures Sister   . Hypertension Maternal Grandmother   . Heart disease Maternal Grandmother   . Kidney disease Maternal Grandmother   . Diabetes Maternal Grandmother   . Hypertension Maternal Grandfather   . Cancer Maternal Grandfather   . Alzheimer's disease Paternal Grandmother   . Diabetes Paternal Grandfather     Medications Prior to Admission  Medication Sig Dispense Refill Last Dose  . ascorbic acid (VITAMIN C) 500 MG tablet Take one tablet by mouth twice a day every other day with Iron tablet. 30 tablet 3 11/22/2020 at Unknown time  . aspirin 81 MG chewable tablet  Chew 1 tablet (81 mg total) by mouth daily. 30 tablet 6 11/22/2020 at Unknown time  . ferrous sulfate (FERROUSUL) 325 (65 FE) MG tablet Take 1 tablet by mouth twice a day every other day with Vitamin C tablet. 30 tablet 3 11/22/2020 at Unknown time  . levothyroxine (SYNTHROID) 50 MCG tablet Take 1 tablet (50 mcg total) by mouth daily before breakfast. 90 tablet 1 11/22/2020 at Unknown time  . Prenatal Vit-Fe Fumarate-FA (M-NATAL PLUS) 27-1 MG TABS TAKE 1 TABLET BY MOUTH DAILY AT 12 NOON. 30 tablet 12 11/22/2020 at Unknown time  . Accu-Chek Softclix Lancets lancets Use as instructed 100 each 12   . Blood Glucose Monitoring Suppl (ACCU-CHEK GUIDE) w/Device KIT 1 each by Does not apply route 4 (four) times daily. 1 kit 0   . glucose blood (ACCU-CHEK GUIDE) test strip Use as instructed to test glucose level four times daily. 100 each 12     No Known Allergies  Review of Systems: Negative except for what is mentioned in HPI.  Physical Exam:  BP 94/80   Pulse (!) 126   Resp 18   LMP 04/18/2020    CONSTITUTIONAL: Well-developed, well-nourished female in no acute distress.   EYES: Conjunctivae and EOM are normal.   SKIN: Skin is warm and dry. No rash noted. Not diaphoretic. No erythema. No pallor.  [place]: Alert and oriented to person, place, and time.   PSYCHIATRIC: Normal mood and affect. Normal behavior.  CARDIOVASCULAR: Normal heart rate noted, regular rhythm  RESPIRATORY: Effort normal, no problems with respiration noted  ABDOMEN: Soft, nontender, nondistended, gravid.  Pelvic Exam: Speculum exam, no lesions present, fetal head visible.    MUSCULOSKELETAL: Dense epidural, unable to move or feel legs  Cervical Exam: Dilatation 10cm   Effacement 100%   Station +2 Presentation: cephalic FHT:  Baseline rate 140 bpm   Variability moderate  Accelerations present   Decelerations late Contractions: Every 1.5-39mns  IFM applied by CNM   Pertinent Labs/Studies:    Results for orders  placed or performed during the hospital encounter of 11/23/20 (from the past 24 hour(s))  POCT fern test     Status: Normal   Collection Time: 11/23/20 10:05 PM  Result Value Ref Range   POCT Fern Test Positive = ruptured amniotic membanes   Type and screen [place]    Status: None   Collection Time: 11/23/20 10:12 PM  Result Value Ref Range   ABO/RH(D) A POS    Antibody Screen NEG    Sample Expiration      11/26/2020,2359 Performed at [facility]  [address], [place], [place] [address]   Resp Panel by RT-PCR (Flu A&B, Covid) Nasopharyngeal Swab     Status: None   Collection Time: 11/23/20 10:22 PM   Specimen: Nasopharyngeal Swab; Nasopharyngeal(NP) swabs in vial transport medium  Result Value Ref Range   SARS Coronavirus 2 by RT PCR NEGATIVE NEGATIVE   Influenza A by PCR NEGATIVE NEGATIVE   Influenza B by PCR NEGATIVE NEGATIVE  CBC     Status: Abnormal   Collection Time: 11/23/20 10:35 PM  Result Value Ref Range   WBC 10.3 4.0 - 10.5 K/uL   RBC 3.68 (L) 3.87 - 5.11 MIL/uL   Hemoglobin 10.2 (L) 12.0 - 15.0 g/dL   HCT 33.0 (L) 36.0 - 46.0 %   MCV 89.7 80.0 - 100.0 fL   MCH 27.7 26.0 - 34.0 pg   MCHC 30.9 30.0 - 36.0 g/dL   RDW 14.8 11.5 - 15.5 %   Platelets 398 150 - 400 K/uL   nRBC 0.0 0.0 - 0.2 %  Glucose, capillary     Status: Abnormal   Collection Time: 11/23/20 10:51 PM  Result Value Ref Range   Glucose-Capillary 108 (H) 70 - 99 mg/dL   Assessment :  [name] is a 35 y.o. G3P1011 at 73w4dbeing admitted for labor.  Plan:  Labor: Transition- second stage, expectant management.   FWB: fetal heart tracing category II .  GBS negative  Delivery plan: Hopeful for vaginal delivery  LDaneil Dan SSy[place]04/25/22 12:20 AM  Midwife attestation: I have seen and examined this patient; I agree with above documentation in the student's note.   PE: Gen: calm comfortable, NAD Resp: normal effort  and rate Abd: gravid GU: no signs of active HSV lesions ROS, labs, PMH reviewed  Assessment/Plan: 36.[redacted] weeks gestation Labor: active FWB: Cat II GBS: neg Admit to LD Anticipate precip delivery Prepare for [place] CNM  11/24/2020, 12:39 AM

## 2020-11-23 NOTE — Anesthesia Preprocedure Evaluation (Signed)
Anesthesia Evaluation  Patient identified by MRN, date of birth, ID band Patient awake    Reviewed: Allergy & Precautions, NPO status , Patient's Chart, lab work & pertinent test results  Airway Mallampati: II  TM Distance: >3 FB Neck ROM: Full    Dental no notable dental hx. (+) Teeth Intact, Dental Advisory Given   Pulmonary neg pulmonary ROS,    Pulmonary exam normal breath sounds clear to auscultation       Cardiovascular negative cardio ROS Normal cardiovascular exam Rhythm:Regular Rate:Normal     Neuro/Psych  Headaches, negative psych ROS   GI/Hepatic negative GI ROS, Neg liver ROS,   Endo/Other  diabetes, GestationalHypothyroidism   Renal/GU negative Renal ROS     Musculoskeletal   Abdominal (+) + obese,   Peds  Hematology  (+) anemia , Lab Results      Component                Value               Date                      WBC                      10.3                11/23/2020                HGB                      10.2 (L)            11/23/2020                HCT                      33.0 (L)            11/23/2020                MCV                      89.7                11/23/2020                PLT                      398                 11/23/2020              Anesthesia Other Findings   Reproductive/Obstetrics (+) Pregnancy                             Anesthesia Physical Anesthesia Plan  ASA: III  Anesthesia Plan: Epidural   Post-op Pain Management:    Induction:   PONV Risk Score and Plan:   Airway Management Planned:   Additional Equipment:   Intra-op Plan:   Post-operative Plan:   Informed Consent:   Plan Discussed with:   Anesthesia Plan Comments: (36.4 wk G3P1 gDM For LEA)        Anesthesia Quick Evaluation

## 2020-11-23 NOTE — Anesthesia Procedure Notes (Signed)
Epidural Patient location during procedure: OB Start time: 11/23/2020 11:01 PM End time: 11/23/2020 11:30 PM  Staffing Anesthesiologist: Trevor Iha, MD Performed: anesthesiologist   Preanesthetic Checklist Completed: patient identified, IV checked, site marked, risks and benefits discussed, surgical consent, monitors and equipment checked, pre-op evaluation and timeout performed  Epidural Patient position: sitting Prep: DuraPrep and site prepped and draped Patient monitoring: continuous pulse ox and blood pressure Approach: midline Location: L3-L4 Injection technique: LOR air  Needle:  Needle type: Tuohy  Needle gauge: 17 G Needle length: 9 cm and 9 Needle insertion depth: 9 cm Catheter type: closed end flexible Catheter size: 19 Gauge Catheter at skin depth: 16 cm Test dose: negative  Assessment Events: blood not aspirated, injection not painful, no injection resistance, no paresthesia and negative IV test  Additional Notes Patient identified. Risks/Benefits/Options discussed with patient including but not limited to bleeding, infection, nerve damage, paralysis, failed block, incomplete pain control, headache, blood pressure changes, nausea, vomiting, reactions to medication both or allergic, itching and postpartum back pain. Confirmed with bedside nurse the patient's most recent platelet count. Confirmed with patient that they are not currently taking any anticoagulation, have any bleeding history or any family history of bleeding disorders. Patient expressed understanding and wished to proceed. All questions were answered. Sterile technique was used throughout the entire procedure. Please see nursing notes for vital signs. Test dose was given through epidural needle and negative prior to continuing to dose epidural or start infusion. Warning signs of high block given to the patient including shortness of breath, tingling/numbness in hands, complete motor block, or any  concerning symptoms with instructions to call for help. Patient was given instructions on fall risk and not to get out of bed. All questions and concerns addressed with instructions to call with any issues. 2 Attempt (S) . Patient tolerated procedure well.

## 2020-11-24 ENCOUNTER — Encounter: Payer: 59 | Admitting: Obstetrics and Gynecology

## 2020-11-24 ENCOUNTER — Ambulatory Visit: Payer: 59

## 2020-11-24 ENCOUNTER — Encounter: Payer: Self-pay | Admitting: *Deleted

## 2020-11-24 ENCOUNTER — Encounter (HOSPITAL_COMMUNITY): Payer: Self-pay | Admitting: Obstetrics & Gynecology

## 2020-11-24 DIAGNOSIS — E039 Hypothyroidism, unspecified: Secondary | ICD-10-CM

## 2020-11-24 DIAGNOSIS — E669 Obesity, unspecified: Secondary | ICD-10-CM

## 2020-11-24 DIAGNOSIS — Z8619 Personal history of other infectious and parasitic diseases: Secondary | ICD-10-CM

## 2020-11-24 DIAGNOSIS — O99284 Endocrine, nutritional and metabolic diseases complicating childbirth: Secondary | ICD-10-CM

## 2020-11-24 DIAGNOSIS — D649 Anemia, unspecified: Secondary | ICD-10-CM

## 2020-11-24 DIAGNOSIS — O9902 Anemia complicating childbirth: Secondary | ICD-10-CM

## 2020-11-24 DIAGNOSIS — O99214 Obesity complicating childbirth: Secondary | ICD-10-CM

## 2020-11-24 DIAGNOSIS — Z3A36 36 weeks gestation of pregnancy: Secondary | ICD-10-CM

## 2020-11-24 DIAGNOSIS — O3663X0 Maternal care for excessive fetal growth, third trimester, not applicable or unspecified: Secondary | ICD-10-CM

## 2020-11-24 DIAGNOSIS — O24420 Gestational diabetes mellitus in childbirth, diet controlled: Secondary | ICD-10-CM

## 2020-11-24 LAB — RPR: RPR Ser Ql: NONREACTIVE

## 2020-11-24 LAB — CBC
HCT: 29.1 % — ABNORMAL LOW (ref 36.0–46.0)
Hemoglobin: 9.1 g/dL — ABNORMAL LOW (ref 12.0–15.0)
MCH: 27.7 pg (ref 26.0–34.0)
MCHC: 31.3 g/dL (ref 30.0–36.0)
MCV: 88.7 fL (ref 80.0–100.0)
Platelets: 356 10*3/uL (ref 150–400)
RBC: 3.28 MIL/uL — ABNORMAL LOW (ref 3.87–5.11)
RDW: 14.8 % (ref 11.5–15.5)
WBC: 12.2 10*3/uL — ABNORMAL HIGH (ref 4.0–10.5)
nRBC: 0 % (ref 0.0–0.2)

## 2020-11-24 MED ORDER — ACETAMINOPHEN 325 MG PO TABS
650.0000 mg | ORAL_TABLET | ORAL | Status: DC | PRN
  Administered 2020-11-24 (×3): 650 mg via ORAL
  Filled 2020-11-24 (×3): qty 2

## 2020-11-24 MED ORDER — PRENATAL MULTIVITAMIN CH
1.0000 | ORAL_TABLET | Freq: Every day | ORAL | Status: DC
  Administered 2020-11-24 – 2020-11-25 (×2): 1 via ORAL
  Filled 2020-11-24 (×2): qty 1

## 2020-11-24 MED ORDER — ONDANSETRON HCL 4 MG/2ML IJ SOLN: 4.0000 mg | INTRAMUSCULAR | Status: DC | PRN

## 2020-11-24 MED ORDER — BENZOCAINE-MENTHOL 20-0.5 % EX AERO: 1.0000 "application " | INHALATION_SPRAY | CUTANEOUS | Status: DC | PRN

## 2020-11-24 MED ORDER — WITCH HAZEL-GLYCERIN EX PADS: 1.0000 "application " | MEDICATED_PAD | CUTANEOUS | Status: DC | PRN

## 2020-11-24 MED ORDER — DIBUCAINE (PERIANAL) 1 % EX OINT: 1.0000 "application " | TOPICAL_OINTMENT | CUTANEOUS | Status: DC | PRN

## 2020-11-24 MED ORDER — ONDANSETRON HCL 4 MG PO TABS: 4.0000 mg | ORAL_TABLET | ORAL | Status: DC | PRN

## 2020-11-24 MED ORDER — SIMETHICONE 80 MG PO CHEW: 80.0000 mg | CHEWABLE_TABLET | ORAL | Status: DC | PRN

## 2020-11-24 MED ORDER — TETANUS-DIPHTH-ACELL PERTUSSIS 5-2.5-18.5 LF-MCG/0.5 IM SUSY
0.5000 mL | PREFILLED_SYRINGE | Freq: Once | INTRAMUSCULAR | Status: AC
  Administered 2020-11-25: 0.5 mL via INTRAMUSCULAR
  Filled 2020-11-24: qty 0.5

## 2020-11-24 MED ORDER — IBUPROFEN 600 MG PO TABS
600.0000 mg | ORAL_TABLET | Freq: Four times a day (QID) | ORAL | Status: DC
  Administered 2020-11-24 – 2020-11-25 (×6): 600 mg via ORAL
  Filled 2020-11-24 (×6): qty 1

## 2020-11-24 MED ORDER — LEVOTHYROXINE SODIUM 50 MCG PO TABS
50.0000 ug | ORAL_TABLET | Freq: Every day | ORAL | Status: DC
  Administered 2020-11-24 – 2020-11-25 (×2): 50 ug via ORAL
  Filled 2020-11-24 (×2): qty 1

## 2020-11-24 MED ORDER — DIPHENHYDRAMINE HCL 25 MG PO CAPS: 25.0000 mg | ORAL_CAPSULE | Freq: Four times a day (QID) | ORAL | Status: DC | PRN

## 2020-11-24 MED ORDER — OXYCODONE HCL 5 MG PO TABS
5.0000 mg | ORAL_TABLET | Freq: Once | ORAL | Status: AC
  Administered 2020-11-24: 5 mg via ORAL
  Filled 2020-11-24: qty 1

## 2020-11-24 MED ORDER — COCONUT OIL OIL: 1.0000 "application " | TOPICAL_OIL | Status: DC | PRN

## 2020-11-24 MED ORDER — SENNOSIDES-DOCUSATE SODIUM 8.6-50 MG PO TABS
2.0000 | ORAL_TABLET | Freq: Every day | ORAL | Status: DC
  Administered 2020-11-24 – 2020-11-25 (×2): 2 via ORAL
  Filled 2020-11-24 (×2): qty 2

## 2020-11-24 MED ORDER — ZOLPIDEM TARTRATE 5 MG PO TABS: 5.0000 mg | ORAL_TABLET | Freq: Every evening | ORAL | Status: DC | PRN

## 2020-11-24 MED ORDER — ACETAMINOPHEN 500 MG PO TABS
1000.0000 mg | ORAL_TABLET | Freq: Four times a day (QID) | ORAL | Status: DC | PRN
  Administered 2020-11-24: 1000 mg via ORAL
  Filled 2020-11-24: qty 2

## 2020-11-24 NOTE — Discharge Summary (Addendum)
Postpartum Discharge Summary     Patient Name: [name] DOB: 01/09/1986 MRN: [id number]  Date of admission: 11/23/2020 Delivery date:11/23/2020  Delivering provider: Julianne Handler  Date of discharge: 11/25/2020  Admitting diagnosis: Preterm labor [O60.00] Intrauterine pregnancy: [redacted]w[redacted]d    Secondary diagnosis:  Active Problems:   Obesity in pregnancy   Thyroid activity decreased   Gestational diabetes   Large for gestational age fetus affecting management of mother   History of herpes genitalis   Preterm labor   SVD (spontaneous vaginal delivery)   Shoulder dystocia, delivered  Additional problems: none    Discharge diagnosis: Preterm Pregnancy Delivered and GDM A1                                              Post partum procedures:None Augmentation: N/A Complications: None  Hospital course: Onset of Labor With Vaginal Delivery      35y.o. yo G3P1011 at 34w5das admitted in Active Labor on 11/23/2020. Patient had an uncomplicated labor course as follows: brief SD relieved with delivery of posterior arm (see Delivery Note). Membrane Rupture Time/Date: 8:45 PM ,11/23/2020   Delivery Method:Vaginal, Spontaneous  Episiotomy: None  Lacerations:  None  Patient had an uncomplicated postpartum course. On PPD#1 her hgb was 9.1 (10.2 on admit). She is ambulating, tolerating a regular diet, passing flatus, and urinating well. Patient is discharged home in stable condition on 11/25/20.  Newborn Data: Birth date:11/23/2020  Birth time:11:54 PM  Gender:Female  Living status:Living  Apgars:8 ,9  Weight:3311 g (7lb 4.8oz)  Magnesium Sulfate received: No BMZ received: No Rhophylac:N/A MMR:N/A T-DaP:Given postpartum Flu: No Transfusion:No  Physical exam  Vitals:   11/24/20 0810 11/24/20 1227 11/24/20 1536 11/24/20 2154  BP: 105/69 (!) 118/57 (!) 99/58 (!) 111/59  Pulse: 86 90 83 88  Resp: _0 Temp: 97.7 F (36.5 C) 98.2 F (36.8 C) 97.9 F (36.6 C) 98 F  (36.7 C)  TempSrc: Oral Oral Oral   SpO2: 100% 98% 98% 99%   General: alert, cooperative and no distress Lochia: appropriate Uterine Fundus: firm Incision: N/A DVT Evaluation: No evidence of DVT seen on physical exam. Labs: Lab Results  Component Value Date   WBC 12.2 (H) 11/24/2020   HGB 9.1 (L) 11/24/2020   HCT 29.1 (L) 11/24/2020   MCV 88.7 11/24/2020   PLT 356 11/24/2020   CMP Latest Ref Rng & Units 06/04/2020  Glucose 65 - 99 mg/dL 92  BUN 6 - 20 mg/dL -  Creatinine 0.44 - 1.00 mg/dL -  Sodium 135 - 145 mmol/L -  Potassium 3.5 - 5.1 mmol/L -  Chloride 98 - 111 mmol/L -  CO2 22 - 32 mmol/L -  Calcium 8.9 - 10.3 mg/dL -   Edinburgh Score: Edinburgh Postnatal Depression Scale Screening Tool 11/25/2020  I have been able to laugh and see the funny side of things. (No Data)     After visit meds:  Allergies as of 11/25/2020   No Known Allergies     Medication List    STOP taking these medications   Accu-Chek Guide test strip Generic drug: glucose blood   Accu-Chek Guide w/Device Kit   Accu-Chek Softclix Lancets lancets   aspirin 81 MG chewable tablet     TAKE these medications   ascorbic acid 500 MG tablet Commonly known as: VITAMIN  C Take one tablet by mouth twice a day every other day with Iron tablet. What changed:   how much to take  how to take this  when to take this   coconut oil Oil Apply 1 application topically as needed.   ferrous sulfate 325 (65 FE) MG tablet Commonly known as: FerrouSul Take 1 tablet by mouth twice a day every other day with Vitamin C tablet. What changed:   how much to take  how to take this  when to take this   ibuprofen 600 MG tablet Commonly known as: ADVIL Take 1 tablet (600 mg total) by mouth every 6 (six) hours.   levothyroxine 50 MCG tablet Commonly known as: SYNTHROID Take 1 tablet (50 mcg total) by mouth daily before breakfast.   M-Natal Plus 27-1 MG Tabs TAKE 1 TABLET BY MOUTH DAILY AT 12  NOON. What changed:   how much to take  how to take this  when to take this  additional instructions   simethicone 80 MG chewable tablet Commonly known as: MYLICON Chew 1 tablet (80 mg total) by mouth as needed for flatulence.        Discharge home in stable condition Infant Feeding: Breast Infant Disposition:home with mother Discharge instruction: per After Visit Summary and Postpartum booklet. Activity: Advance as tolerated. Pelvic rest for 6 weeks.  Diet: routine diet Future Appointments: No future appointments. Follow up Visit:  [place] [facility] [place] at [facility]. Schedule an appointment as soon as possible for a visit in 4 week(s).   Specialty: Obstetrics and Gynecology Why: for your postpartum appointment; you will also need another diabetes test in approximately 4-6 weeks Contact information: [address] [place] [address] [phone]               Please schedule this patient for a In person postpartum visit in 6 weeks with the following provider: Any provider. Additional Postpartum F/U:2 hour GTT  High risk pregnancy complicated by: GDM Delivery mode:  Vaginal, Spontaneous  Anticipated Birth Control:  Unsure, considering outpatient IUD   11/25/2020 Myrtis Ser, CNM 12:12 PM

## 2020-11-24 NOTE — Lactation Note (Signed)
This note was copied from a baby's chart. Lactation Consultation Note  Patient Name: [name] [id number]'K Date: 11/24/2020 Reason for consult: Follow-up assessment;Mother's request;Primapara;1st time breastfeeding;Late-preterm 34-36.6wks;Maternal endocrine disorder (Anemia) Age:34 hours  Infant showing cues. LC assisted Mom with latching on the  Left breast. Mom had some pain we got a deeper latch in football with breast compression. Mom stated pain 2 to 0 with changes made.   Mom encouraged to offer both breasts in the feeding, placing infant STS and compress breasts to increase transfer during a feeding.  Mom requested manual pump. LC set it up reviewed pump parts and assembly.   Mom declined supplementation with DBM. Mom's preference for supplementation was formula.  LC reviewed how to reduce calorie loss for LPTI including keeping total feeding under 30 minutes.   Plan 1. To feed based on cues 8-12x in 24 hr period no more than 3 hrs without an attempt.          2. Mom to supplement with EBM/formula via curve tip and finger feeding.           3 Mom to pump manual pump 10 minutes on each breasts.      Mom still nursing at the end of the visit. Mom to call for RN assistance to supplement with formula.  All questions answered at the end of the visit.  Maternal Data Has patient been taught Hand Expression?: Yes Does the patient have breastfeeding experience prior to this delivery?: No  Feeding Mother's Current Feeding Choice: Breast Milk and Formula  LATCH Score Latch: Repeated attempts needed to sustain latch, nipple held in mouth throughout feeding, stimulation needed to elicit sucking reflex.  Audible Swallowing: Spontaneous and intermittent  Type of Nipple: Everted at rest and after stimulation  Comfort (Breast/Nipple): Filling, red/small blisters or bruises, mild/mod discomfort  Hold (Positioning): Assistance needed to correctly position infant at breast and maintain  latch.  LATCH Score: 7   Lactation Tools Discussed/Used Tools: Pump;Flanges Flange Size: 24 Breast pump type: Double-Electric Breast Pump;Manual Pump Education: Setup, frequency, and cleaning;Milk Storage Reason for Pumping: increase stimulation Pumping frequency: every 3 hrs for 15 minutes  Interventions Interventions: Breast feeding basics reviewed;Support pillows;Education;Assisted with latch;Position options;Skin to skin;Expressed milk;Breast massage;Hand express;Hand pump;DEBP;Breast compression;Adjust position  Discharge Pump: Manual WIC Program: No  Consult Status Consult Status: Follow-up Date: 11/25/20 Follow-up type: In-patient    Tamara Kenan  Howard 11/24/2020, 1:25 PM

## 2020-11-24 NOTE — Lactation Note (Signed)
This note was copied from a baby's chart. Lactation Consultation Note Baby less than hr old. LC assisted in latching. Mom has everted nipples. Baby off and on. Tongue thrusting. Will suckle some then tongue thrust nipple out. Repeated frequently. Mom shaking. IV in [facility] so she can't move her Rt. Arm. LC latched baby to Lt. Breast.  Mom has a almost 34 yr old that she BF for a couple of weeks. Mom will be f/u on MBU.  Patient Name: [name] OEVOJ'J Date: 11/24/2020 Reason for consult: L&D Initial assessment;Late-preterm 34-36.6wks Age:34 hours  Maternal Data    Feeding    LATCH Score                    Lactation Tools Discussed/Used    Interventions    Discharge WIC Program: No  Consult Status Consult Status: Follow-up Date: 11/24/20 Follow-up type: In-patient    Charyl Dancer 11/24/2020, 12:50 AM

## 2020-11-24 NOTE — Anesthesia Postprocedure Evaluation (Signed)
Anesthesia Post Note  Patient: [name]  Procedure(s) Performed: AN AD HOC LABOR EPIDURAL     Patient location during evaluation: Mother Baby Anesthesia Type: Epidural Level of consciousness: awake and alert, awake and oriented Pain management: pain level controlled Vital Signs Assessment: post-procedure vital signs reviewed and stable Respiratory status: spontaneous breathing and respiratory function stable Cardiovascular status: blood pressure returned to baseline Postop Assessment: no headache, epidural receding, patient able to bend at knees, adequate PO intake, no backache, no apparent nausea or vomiting and able to ambulate Anesthetic complications: no   No complications documented.  Last Vitals:  Vitals:   11/24/20 0400 11/24/20 0810  BP: 110/63 105/69  Pulse: 86 86  Resp: 16 20  Temp: 36.7 C 36.5 C  SpO2: 100% 100%    Last Pain:  Vitals:   11/24/20 0810  TempSrc: Oral  PainSc: 4    Pain Goal: Patients Stated Pain Goal: 2 (11/24/20 0400)              Epidural/Spinal Function Cutaneous sensation: Normal sensation (11/24/20 0810), Patient able to flex knees: Yes (11/24/20 0810), Patient able to lift hips off bed: Yes (11/24/20 0810), Back pain beyond tenderness at insertion site: No (11/24/20 0810), Progressively worsening motor and/or sensory loss: No (11/24/20 0810)  Cleda Clarks

## 2020-11-24 NOTE — Progress Notes (Addendum)
Patient ID: [name], female   DOB: [birth year], 35 y.o.   MRN: [id number]    POSTPARTUM PROGRESS NOTE  Post Partum Day 1  Subjective:  [name] is a 35 y.o. [id number] s/p SVD at [redacted]w[redacted]d.  No acute events overnight.  Pt denies problems with ambulating, voiding or po intake.  She denies nausea or vomiting.  Pain is poorly controlled; abdominal cramping worse with BF.  She has had flatus. She has had bowel movement.  Lochia Small.   Objective: Blood pressure 105/69, pulse 86, temperature 97.7 F (36.5 C), temperature source Oral, resp. rate 20, last menstrual period 04/18/2020, SpO2 100 %, unknown if currently breastfeeding.  Physical Exam:  General: alert, cooperative and no distress Chest: no respiratory distress Heart:regular rate, distal pulses intact Abdomen: soft, nontender,  Uterine Fundus: firm, appropriately tender DVT Evaluation: No calf swelling or tenderness Extremities: NO edema Skin: warm, dry  Recent Labs    11/23/20 2235 11/24/20 0414  HGB 10.2* 9.1*  HCT 33.0* 29.1*    Assessment/Plan: [name] is a 35 y.o. [id number] s/p SVD at [redacted]w[redacted]d   PPD#1 - Doing well, pain poorly controlled  Contraception: In office Mirena IUD Feeding: breast Dispo: Plan for discharge tomorrow # Roxicodone 5mg  X 1 dose    LOS: 1 day   I, NP 11/24/2020, 10:20 AM

## 2020-11-24 NOTE — Lactation Note (Signed)
This note was copied from a baby's chart. Lactation Consultation Note Baby 5 hrs old. Mom stated she had just finished feeding the baby and she is doing really good but favors the Lt. Breast. Mom is cramping. Mom asked about pumping. LC suggested pumping w/hospital DEBP. Mom shown how to use DEBP & how to disassemble, clean, & reassemble parts. Mom knows to pump q3h for 15-20 min. Mom wanted to pump. Mom pumped about 10 minutes then said she was done. She is sleepy and her stomach is hurting. Mom encouraged to feed baby 8-12 times/24 hours and with feeding cues.  LPI information sheet given. Discussed supplementing according to hours of age. Mom is breast/formula feeding.  Encouraged mom to call for assistance or questions. Mom is tired and no further education can be done at this time. Lactation brochure given.   Patient Name: [name] [id number] Date: 11/24/2020 Reason for consult: Initial assessment;Late-preterm 34-36.6wks Age:34 hours  Maternal Data Does the patient have breastfeeding experience prior to this delivery?: Yes How long did the patient breastfeed?: a few weeks  Feeding    LATCH Score Latch: Too sleepy or reluctant, no latch achieved, no sucking elicited.  Audible Swallowing: None  Type of Nipple: Everted at rest and after stimulation  Comfort (Breast/Nipple): Soft / non-tender  Hold (Positioning): Assistance needed to correctly position infant at breast and maintain latch.  LATCH Score: 5   Lactation Tools Discussed/Used Tools: Pump Breast pump type: Double-Electric Breast Pump Pump Education: Setup, frequency, and cleaning;Milk Storage Reason for Pumping: LPI Pumping frequency: Q 3 hrs  Interventions Interventions: Breast compression;DEBP  Discharge WIC Program: No  Consult Status Consult Status: Follow-up Date: 11/24/20 Follow-up type: In-patient    Charyl Dancer 11/24/2020, 5:15 AM

## 2020-11-25 ENCOUNTER — Ambulatory Visit: Payer: Self-pay

## 2020-11-25 MED ORDER — OXYCODONE HCL 5 MG PO TABS
5.0000 mg | ORAL_TABLET | ORAL | Status: DC | PRN
  Administered 2020-11-25 (×3): 5 mg via ORAL
  Filled 2020-11-25 (×3): qty 1

## 2020-11-25 MED ORDER — OXYCODONE HCL 5 MG PO TABS: 5.0000 mg | ORAL_TABLET | ORAL | 0 refills | Status: DC | PRN

## 2020-11-25 MED ORDER — SIMETHICONE 80 MG PO CHEW: 80.0000 mg | CHEWABLE_TABLET | ORAL | 0 refills | Status: DC | PRN

## 2020-11-25 MED ORDER — COCONUT OIL OIL: 1.0000 "application " | TOPICAL_OIL | 0 refills | Status: DC | PRN

## 2020-11-25 MED ORDER — IBUPROFEN 600 MG PO TABS
600.0000 mg | ORAL_TABLET | Freq: Four times a day (QID) | ORAL | 0 refills | Status: DC
Start: 2020-11-25 — End: 2021-06-05

## 2020-11-25 NOTE — Progress Notes (Signed)
Discharge instructions given, all questions answered, pt verbalizes understanding. Pt is alert and oriented x4 and ambulatory. Pt will stay as visitor/support for her infant who is still a patient.

## 2020-11-25 NOTE — Plan of Care (Signed)
  Problem: Health Behavior/Discharge Planning: Goal: Ability to manage health-related needs will improve Outcome: Adequate for Discharge   Problem: Clinical Measurements: Goal: Ability to maintain clinical measurements within normal limits will improve Outcome: Adequate for Discharge Goal: Will remain free from infection Outcome: Adequate for Discharge Goal: Diagnostic test results will improve Outcome: Adequate for Discharge Goal: Respiratory complications will improve Outcome: Adequate for Discharge Goal: Cardiovascular complication will be avoided Outcome: Adequate for Discharge   Problem: Activity: Goal: Risk for activity intolerance will decrease Outcome: Adequate for Discharge   Problem: Nutrition: Goal: Adequate nutrition will be maintained Outcome: Adequate for Discharge   Problem: Coping: Goal: Level of anxiety will decrease Outcome: Adequate for Discharge   Problem: Elimination: Goal: Will not experience complications related to bowel motility Outcome: Adequate for Discharge Goal: Will not experience complications related to urinary retention Outcome: Adequate for Discharge   Problem: Pain Managment: Goal: General experience of comfort will improve Outcome: Adequate for Discharge   Problem: Safety: Goal: Ability to remain free from injury will improve Outcome: Adequate for Discharge   Problem: Skin Integrity: Goal: Risk for impaired skin integrity will decrease Outcome: Adequate for Discharge   Problem: Education: Goal: Knowledge of Childbirth will improve Outcome: Adequate for Discharge Goal: Ability to make informed decisions regarding treatment and plan of care will improve Outcome: Adequate for Discharge Goal: Ability to state and carry out methods to decrease the pain will improve Outcome: Adequate for Discharge Goal: Individualized Educational Video(s) Outcome: Adequate for Discharge   Problem: Coping: Goal: Ability to verbalize concerns and  feelings about labor and delivery will improve Outcome: Adequate for Discharge   Problem: Life Cycle: Goal: Ability to make normal progression through stages of labor will improve Outcome: Adequate for Discharge Goal: Ability to effectively push during vaginal delivery will improve Outcome: Adequate for Discharge   Problem: Role Relationship: Goal: Will demonstrate positive interactions with the child Outcome: Adequate for Discharge   Problem: Safety: Goal: Risk of complications during labor and delivery will decrease Outcome: Adequate for Discharge   Problem: Pain Management: Goal: Relief or control of pain from uterine contractions will improve Outcome: Adequate for Discharge   Problem: Education: Goal: Knowledge of condition will improve Outcome: Adequate for Discharge Goal: Individualized Educational Video(s) Outcome: Adequate for Discharge Goal: Individualized Newborn Educational Video(s) Outcome: Adequate for Discharge   Problem: Activity: Goal: Will verbalize the importance of balancing activity with adequate rest periods Outcome: Adequate for Discharge Goal: Ability to tolerate increased activity will improve Outcome: Adequate for Discharge   Problem: Coping: Goal: Ability to identify and utilize available resources and services will improve Outcome: Adequate for Discharge   Problem: Life Cycle: Goal: Chance of risk for complications during the postpartum period will decrease Outcome: Adequate for Discharge   Problem: Role Relationship: Goal: Ability to demonstrate positive interaction with newborn will improve Outcome: Adequate for Discharge   Problem: Skin Integrity: Goal: Demonstration of wound healing without infection will improve Outcome: Adequate for Discharge

## 2020-11-25 NOTE — Lactation Note (Signed)
This note was copied from a baby's chart. Lactation Consultation Note  Patient Name: [name] [id number] Date: 11/25/2020 Reason for consult: Follow-up assessment;Mother's request;Difficult latch;Primapara;1st time breastfeeding;Late-preterm 34-36.6wks;Infant weight loss;Maternal endocrine disorder Age:34 hours  LC reviewed with Mom feeding by cues 8-12x in 24 hr period, no more than 3 hrs without an attempt. Infant not latching at each feeding. Infant also swaddled in an outfit.   LC reviewed with Mom importance of decreasing calorie loss keeping infant STS, offering both breasts, paced bottle feeding EBM first than formula with total feeding under 30 minutes.   LC not able to see a latch at the time of the visit since infant was still sleeping. Infant frequent urine but no stool since 4/25 8 pm. Mom to increase supplementation volumes if infant not able to latch at the breast. Breastfeeding/ formula comparison supplementation guide provided increasing volumes as pertains to LPTI. Mom aware if infant not latching at a feed to offer more.   Mom denied any pain with the latch and using coconut oil for nipple care.  Mom to call for assistance with next latch from RN or LC.   Plan 1. To feed based on cues , STS with breast compression.           2. Paced bottle feed EBM first then formula.           3. DEBP q 3 hrs for 15 minutes.  LC reviewed plan with RN, Raliegh Ip.   All questions answered at the end of the visit.    Maternal Data    Feeding Mother's Current Feeding Choice: Breast Milk and Formula  LATCH Score                    Lactation Tools Discussed/Used Tools: Pump;Flanges Flange Size: 27 Breast pump type: Double-Electric Breast Pump Pump Education: Setup, frequency, and cleaning;Milk Storage Reason for Pumping: increase stimulation Pumping frequency: every 3 hrs for 15 minutes  Interventions Interventions: Breast feeding basics  reviewed;Education;Skin to skin;DEBP;Coconut oil  Discharge Pump: Manual WIC Program: No  Consult Status Consult Status: Follow-up Date: 11/26/20 Follow-up type: In-patient    Saquan Furtick  Nicholson-Springer 11/25/2020, 5:45 PM

## 2020-11-25 NOTE — Discharge Instructions (Signed)

## 2020-11-26 ENCOUNTER — Ambulatory Visit: Payer: Self-pay

## 2020-11-26 ENCOUNTER — Other Ambulatory Visit: Payer: 59

## 2020-11-26 NOTE — Lactation Note (Incomplete)
This note was copied from a baby's chart. Lactation Consultation Note  Patient Name: [name] [id number] Date: 11/26/2020 Reason for consult: Follow-up assessment;Late-preterm 34-36.6wks Age:34 hours   P1, Baby CGA 37 weeks.  Mother pumping upon entering with 27 mm flanges. Refitted mother to 24 mm flanges.  Mother comfortable.  Observed pumping session with 44 ml expressed. Discussed supplementing with mother's milk.     Feeding Mother's Current Feeding Choice: Breast Milk and Formula   Lactation Tools Discussed/Used Tools: Pump Flange Size: 24 Breast pump type: Double-Electric Breast Pump Pump Education: Setup, frequency, and cleaning;Milk Storage Reason for Pumping: stimulation and supplementation Pumping frequency: 8 times per day Pumped volume: 44 mL  Interventions Interventions: DEBP;Education;Shells  Discharge Discharge Education: Engorgement and breast care;Warning signs for feeding baby;Outpatient recommendation Pump: Advised to call insurance company  Consult Status Consult Status: Complete Date: 11/26/20    Dahlia Byes [facility] 11/26/2020, 11:00 AM

## 2020-11-26 NOTE — Lactation Note (Addendum)
This note was copied from a baby's chart. Lactation Consultation Note Baby 50 hre old. Mom giving formula when LC entered rm. Asked mom how BF going mom said good. Asked mom if baby had stool in the night mom stated yes. Mom looking very sleepy. Mom stated after she burps the baby she is going to take a nap. Mom is to sleepy for discharge teaching at this time. Mom states she is going home today.  Patient Name: [name] [id number] Date: 11/26/2020 Reason for consult: Follow-up assessment;Late-preterm 34-36.6wks Age:47 hours  Maternal Data    Feeding Nipple Type: Slow - flow  LATCH Score                    Lactation Tools Discussed/Used Breast pump type: Double-Electric Breast Pump  Interventions    Discharge    Consult Status Consult Status: Follow-up Date: 11/26/20 Follow-up type: In-patient    Charyl Dancer 11/26/2020, 6:32 AM

## 2020-11-28 ENCOUNTER — Emergency Department (HOSPITAL_BASED_OUTPATIENT_CLINIC_OR_DEPARTMENT_OTHER): Payer: 59

## 2020-11-28 ENCOUNTER — Other Ambulatory Visit: Payer: Self-pay

## 2020-11-28 ENCOUNTER — Encounter (HOSPITAL_COMMUNITY): Payer: Self-pay

## 2020-11-28 ENCOUNTER — Emergency Department (HOSPITAL_COMMUNITY)
Admission: EM | Admit: 2020-11-28 | Discharge: 2020-11-28 | Disposition: A | Discharge status: Home / Self Care | Payer: 59 | Attending: Emergency Medicine | Admitting: Emergency Medicine

## 2020-11-28 DIAGNOSIS — M79661 Pain in right lower leg: Secondary | ICD-10-CM | POA: Diagnosis not present

## 2020-11-28 DIAGNOSIS — R600 Localized edema: Secondary | ICD-10-CM | POA: Diagnosis not present

## 2020-11-28 DIAGNOSIS — Z79899 Other long term (current) drug therapy: Secondary | ICD-10-CM | POA: Diagnosis not present

## 2020-11-28 DIAGNOSIS — E039 Hypothyroidism, unspecified: Secondary | ICD-10-CM | POA: Insufficient documentation

## 2020-11-28 DIAGNOSIS — R609 Edema, unspecified: Secondary | ICD-10-CM | POA: Diagnosis not present

## 2020-11-28 NOTE — ED Triage Notes (Signed)
Emergency Medicine Provider Triage Evaluation Note  [name] , a 35 y.o. female  was evaluated in triage.  Pt complains of patient presents with right-sided leg swelling, going on since April 26, she denies any recent trauma to the area.  She has some pain in her calf, denies numbness or tingling in that leg.  Denies any chest pain or shortness of breath.  No history of PEs or DVTs..  Review of Systems  Positive: Right leg swelling with right calf pain. Negative: Denies shortness of breath, chest pain,  Physical Exam  BP 132/89 (BP Location: Left Arm)   Pulse 74   Temp 98.4 F (36.9 C) (Oral)   Resp 18   LMP 04/18/2020   SpO2 100%  Gen:   Awake, no distress   HEENT:  Atraumatic  Resp:  Normal effort  Cardiac:  Normal rate  MSK:   Moves extremities without difficulty  Neuro:  Speech clear   Medical Decision Making  Medically screening exam initiated at 1:55 PM.  Appropriate orders placed.  [name] was informed that the remainder of the evaluation will be completed by another provider, this initial triage assessment does not replace that evaluation, and the importance of remaining in the ED until their evaluation is complete.  Clinical Impression  Right calf swelling, lab work and imaging have been ordered, patient need further work-up in the emergency department.   Tamara Sage, PA-C 11/28/20 1358

## 2020-11-28 NOTE — ED Triage Notes (Signed)
Pt arrived via walk in, c/o right leg swelling and pain in calf area. States this has been progressively worse since 4/24 after vaginal delivery. No hx of blood clots.

## 2020-11-28 NOTE — Discharge Instructions (Addendum)
Home to rest. Elevate legs to help with swelling. Monitor your blood pressure. If blood pressure if 140/90 or higher, call your OB, they may have you return to womens or the ER for further work up.

## 2020-11-28 NOTE — ED Provider Notes (Signed)
[facility]-EMERGENCY DEPT Provider Note   CSN: [id number] Arrival date & time: 11/28/20  1259     History Chief Complaint  Patient presents with  . Leg Swelling    [name] is a 35 y.o. female.  35 year old female presents with complaint of right leg swelling with pain in her right calf onset today. Reports she is 5 days post partum, is concerned for DVT. Patient had gestational diabetes, no complications with blood pressure, uncomplicated vaginal delivery with epidural. Denies shortness of breath, headaches or other complaints. No prior PE or DVT.         Past Medical History:  Diagnosis Date  . ADD (attention deficit disorder)   . Allergy    RHINITIS  . BV (bacterial vaginosis)   . Eczema   . Headache(784.0)    intermittent  . Hypothyroidism   . Obesity   . UTI (lower urinary tract infection)    not frequently    Patient Active Problem List   Diagnosis Date Noted  . SVD (spontaneous vaginal delivery) 11/24/2020  . Shoulder dystocia, delivered 11/24/2020  . History of herpes genitalis 11/23/2020  . Preterm labor 11/23/2020  . Large for gestational age fetus affecting management of mother 11/13/2020  . [redacted] weeks gestation of pregnancy 11/13/2020  . [redacted] weeks gestation of pregnancy 10/13/2020  . Gestational diabetes 09/30/2020  . Supervision of other normal pregnancy, antepartum 05/19/2020  . Anemia, unspecified 12/02/2015  . Thyroid activity decreased 12/02/2015  . ADD (attention deficit disorder) 12/02/2015  . Encounter for health maintenance examination in adult 07/16/2015  . Obesity in pregnancy 07/16/2015  . Family history of DVT 07/16/2015  . Family history of diabetes mellitus 07/16/2015  . Anemia, iron deficiency 07/16/2015    Past Surgical History:  Procedure Laterality Date  . WISDOM TOOTH EXTRACTION       OB History    Gravida  3   Para  2   Term  1   Preterm  1   AB  1   Living  2     SAB      IAB  1    Ectopic      Multiple  0   Live Births  2           Family History  Problem Relation Age of Onset  . Fibromyalgia Mother   . Lupus Mother   . Obesity Mother   . Kidney disease Mother   . Deep vein thrombosis Father   . Hypertension Father   . Diabetes Father   . Obesity Father   . Asthma Sister   . Seizures Sister   . Hypertension Maternal Grandmother   . Heart disease Maternal Grandmother   . Kidney disease Maternal Grandmother   . Diabetes Maternal Grandmother   . Hypertension Maternal Grandfather   . Cancer Maternal Grandfather   . Alzheimer's disease Paternal Grandmother   . Diabetes Paternal Grandfather     Social History   Tobacco Use  . Smoking status: Never Smoker  . Smokeless tobacco: Never Used  Vaping Use  . Vaping Use: Never used  Substance Use Topics  . Alcohol use: Not Currently    Comment: 1-2 nights weekly  . Drug use: No    Home Medications Prior to Admission medications   Medication Sig Start Date End Date Taking? Authorizing Provider  ascorbic acid (VITAMIN C) 500 MG tablet Take one tablet by mouth twice a day every other day with Iron  tablet. Patient taking differently: Take 500 mg by mouth See admin instructions. Take one tablet by mouth twice a day every other day with Iron tablet. 09/30/20   Raelyn Moraawson, Rolitta, CNM  coconut oil OIL Apply 1 application topically as needed. 11/25/20   Maness, Loistine ChancePhilip, MD  ferrous sulfate (FERROUSUL) 325 (65 FE) MG tablet Take 1 tablet by mouth twice a day every other day with Vitamin C tablet. Patient taking differently: Take 325 mg by mouth See admin instructions. Take 1 tablet by mouth twice a day every other day with Vitamin C tablet. 09/30/20   Raelyn Moraawson, Rolitta, CNM  ibuprofen (ADVIL) 600 MG tablet Take 1 tablet (600 mg total) by mouth every 6 (six) hours. 11/25/20   Maness, Loistine ChancePhilip, MD  levothyroxine (SYNTHROID) 50 MCG tablet Take 1 tablet (50 mcg total) by mouth daily before breakfast. 08/08/20   Gerrit HeckEmly, Jessica, CNM   oxyCODONE (OXY IR/ROXICODONE) 5 MG immediate release tablet Take 1 tablet (5 mg total) by mouth every 4 (four) hours as needed for moderate pain. 11/25/20   Arabella MerlesShaw, Kimberly D, CNM  Prenatal Vit-Fe Fumarate-FA (M-NATAL PLUS) 27-1 MG TABS TAKE 1 TABLET BY MOUTH DAILY AT 12 NOON. Patient taking differently: Take 1 tablet by mouth daily. 07/01/20   Raelyn Moraawson, Rolitta, CNM  simethicone (MYLICON) 80 MG chewable tablet Chew 1 tablet (80 mg total) by mouth as needed for flatulence. 11/25/20   Jovita KussmaulManess, Philip, MD    Allergies    Patient has no known allergies.  Review of Systems   Review of Systems  Constitutional: Negative for fever.  Respiratory: Negative for shortness of breath.   Gastrointestinal: Negative for abdominal pain and vomiting.  Musculoskeletal: Positive for myalgias.  Skin: Negative for rash and wound.  Allergic/Immunologic: Negative for immunocompromised state.  Neurological: Negative for weakness and numbness.  All other systems reviewed and are negative.   Physical Exam Updated Vital Signs BP 127/75 (BP Location: Left Arm)   Pulse 79   Temp 98.4 F (36.9 C) (Oral)   Resp 16   LMP 04/18/2020   SpO2 99%   Physical Exam Vitals and nursing note reviewed.  Constitutional:      General: She is not in acute distress.    Appearance: She is well-developed. She is not diaphoretic.  HENT:     Head: Normocephalic and atraumatic.  Cardiovascular:     Rate and Rhythm: Normal rate and regular rhythm.     Pulses: Normal pulses.     Heart sounds: Normal heart sounds.  Pulmonary:     Effort: Pulmonary effort is normal.     Breath sounds: Normal breath sounds.  Abdominal:     Palpations: Abdomen is soft.     Tenderness: There is no abdominal tenderness.  Musculoskeletal:     Right lower leg: Edema present.     Left lower leg: Edema present.  Skin:    General: Skin is warm and dry.     Findings: No erythema or rash.  Neurological:     Mental Status: She is alert and oriented  to person, place, and time.     Sensory: No sensory deficit.     Motor: No weakness.  Psychiatric:        Behavior: Behavior normal.     ED Results / Procedures / Treatments   Labs (all labs ordered are listed, but only abnormal results are displayed) Labs Reviewed - No data to display  EKG None  Radiology VAS US LOWER EXTREMITY VENOUS (DVT) (ONLY [facility] &  [facility])  Result Date: 11/28/2020  Lower Venous DVT Study Patient Name:  [name]   Date of Exam:   11/28/2020 Medical Rec #: [id number]   Accession #:    [id number] Date of Birth: 1985/08/09  Patient Gender: F Patient Age:   34Y Exam Location:  [facility] Procedure:      VAS [place] LOWER EXTREMITY VENOUS (DVT) Referring Phys: [id number] Carroll Sage --------------------------------------------------------------------------------  Indications: Edema. Other Indications: Recent pregnancy. Comparison Study: no prior Performing Technologist: Blanch Media RVS  Examination Guidelines: A complete evaluation includes B-mode imaging, spectral Doppler, color Doppler, and power Doppler as needed of all accessible portions of each vessel. Bilateral testing is considered an integral part of a complete examination. Limited examinations for reoccurring indications may be performed as noted. The reflux portion of the exam is performed with the patient in reverse Trendelenburg.  +---------+---------------+---------+-----------+----------+-------------------+ RIGHT    CompressibilityPhasicitySpontaneityPropertiesThrombus Aging      +---------+---------------+---------+-----------+----------+-------------------+ CFV      Full           Yes      Yes                                      +---------+---------------+---------+-----------+----------+-------------------+ SFJ      Full                                                             +---------+---------------+---------+-----------+----------+-------------------+ FV Prox  Full                                                              +---------+---------------+---------+-----------+----------+-------------------+ FV Mid   Full                                                             +---------+---------------+---------+-----------+----------+-------------------+ FV DistalFull                                                             +---------+---------------+---------+-----------+----------+-------------------+ PFV      Full                                                             +---------+---------------+---------+-----------+----------+-------------------+ POP      Full           Yes      Yes                                      +---------+---------------+---------+-----------+----------+-------------------+  PTV      Full                                                             +---------+---------------+---------+-----------+----------+-------------------+ PERO                                                  Not well visualized +---------+---------------+---------+-----------+----------+-------------------+   +----+---------------+---------+-----------+----------+--------------+ LEFTCompressibilityPhasicitySpontaneityPropertiesThrombus Aging +----+---------------+---------+-----------+----------+--------------+ CFV Full           Yes      Yes                                 +----+---------------+---------+-----------+----------+--------------+    Summary: RIGHT: - There is no evidence of deep vein thrombosis in the lower extremity.  - No cystic structure found in the popliteal fossa.  LEFT: - No evidence of common femoral vein obstruction.  *See table(s) above for measurements and observations.    Preliminary     Procedures Procedures   Medications Ordered in ED Medications - No data to display  ED Course  I have reviewed the triage vital signs and the nursing notes.  Pertinent labs & imaging results that were  available during my care of the patient were reviewed by me and considered in my medical decision making (see chart for details).  Clinical Course as of 11/28/20 1714  Fri Nov 28, 2020  1707 BP 127/75 [LM]  [id number] 35 year old female 5 days postpartum with complaint of right leg swelling with pain in her calf about history of DVT or PE.  On exam, does have swelling to her lower extremities bilaterally with mild right calf tenderness.  DP pulses present, sensation intact, leg strength equal. Doppler study obtained from triage is negative for DVT in this leg. Patient has 1 elevated blood pressure reading of 145/95 on record which was taken right after patient was transitioned to the bed in the emergency room.  She has no history of hypertension, had normal blood pressures while in the hospital for delivery.  On recheck obtained 12 minutes later, patient's blood pressure has improved to 127/75.  Doubt preeclampsia.  Patient is advised to monitor her blood pressure, raise her legs and follow-up with her OB.  Given return to ER precautions. [LM]    Clinical Course User Index [LM] Alden Hipp   MDM Rules/Calculators/A&P                          Final Clinical Impression(s) / ED Diagnoses Final diagnoses:  Peripheral edema    Rx / DC Orders ED Discharge Orders    None       Jeannie Fend, PA-C 11/28/20 1714    Derwood Kaplan, MD 11/28/20 2108

## 2020-11-28 NOTE — Progress Notes (Signed)
Lower extremity venous has been completed.   Preliminary results in CV Proc.   Blanch Media 11/28/2020 2:31 PM

## 2020-12-04 ENCOUNTER — Ambulatory Visit: Payer: 59

## 2020-12-04 ENCOUNTER — Encounter: Payer: 59 | Admitting: Obstetrics and Gynecology

## 2020-12-04 ENCOUNTER — Other Ambulatory Visit: Payer: Self-pay

## 2020-12-10 ENCOUNTER — Ambulatory Visit: Payer: Self-pay

## 2020-12-10 ENCOUNTER — Ambulatory Visit: Payer: 59

## 2020-12-12 ENCOUNTER — Ambulatory Visit: Payer: 59

## 2020-12-24 ENCOUNTER — Encounter: Payer: Self-pay | Admitting: Obstetrics and Gynecology

## 2020-12-24 ENCOUNTER — Ambulatory Visit: Payer: 59 | Admitting: Obstetrics and Gynecology

## 2020-12-25 NOTE — Progress Notes (Signed)
Patient did not keep her postpartum appointment for 12/24/2020.  Cornelia Copa MD Attending [facility] for [employer] [facility])

## 2021-05-14 ENCOUNTER — Emergency Department (HOSPITAL_BASED_OUTPATIENT_CLINIC_OR_DEPARTMENT_OTHER)
Admission: EM | Admit: 2021-05-14 | Discharge: 2021-05-15 | Disposition: A | Discharge status: Home / Self Care | Payer: Self-pay | Attending: Emergency Medicine | Admitting: Emergency Medicine

## 2021-05-14 ENCOUNTER — Other Ambulatory Visit: Payer: Self-pay

## 2021-05-14 ENCOUNTER — Encounter (HOSPITAL_BASED_OUTPATIENT_CLINIC_OR_DEPARTMENT_OTHER): Payer: Self-pay

## 2021-05-14 ENCOUNTER — Inpatient Hospital Stay (HOSPITAL_COMMUNITY)
Admission: AD | Admit: 2021-05-14 | Discharge: 2021-05-14 | Disposition: A | Discharge status: Home / Self Care | Payer: Self-pay | Attending: Obstetrics and Gynecology | Admitting: Obstetrics and Gynecology

## 2021-05-14 DIAGNOSIS — R222 Localized swelling, mass and lump, trunk: Secondary | ICD-10-CM

## 2021-05-14 DIAGNOSIS — R10817 Generalized abdominal tenderness: Secondary | ICD-10-CM

## 2021-05-14 DIAGNOSIS — R1900 Intra-abdominal and pelvic swelling, mass and lump, unspecified site: Secondary | ICD-10-CM | POA: Insufficient documentation

## 2021-05-14 DIAGNOSIS — R1909 Other intra-abdominal and pelvic swelling, mass and lump: Secondary | ICD-10-CM

## 2021-05-14 DIAGNOSIS — Z3202 Encounter for pregnancy test, result negative: Secondary | ICD-10-CM

## 2021-05-14 DIAGNOSIS — K429 Umbilical hernia without obstruction or gangrene: Secondary | ICD-10-CM

## 2021-05-14 DIAGNOSIS — R1011 Right upper quadrant pain: Secondary | ICD-10-CM

## 2021-05-14 DIAGNOSIS — Z79899 Other long term (current) drug therapy: Secondary | ICD-10-CM | POA: Insufficient documentation

## 2021-05-14 DIAGNOSIS — R109 Unspecified abdominal pain: Secondary | ICD-10-CM | POA: Insufficient documentation

## 2021-05-14 DIAGNOSIS — E039 Hypothyroidism, unspecified: Secondary | ICD-10-CM | POA: Insufficient documentation

## 2021-05-14 DIAGNOSIS — K420 Umbilical hernia with obstruction, without gangrene: Secondary | ICD-10-CM | POA: Insufficient documentation

## 2021-05-14 LAB — LIPASE, BLOOD: Lipase: 10 U/L — ABNORMAL LOW (ref 11–51)

## 2021-05-14 LAB — CBC
HCT: 33.9 % — ABNORMAL LOW (ref 36.0–46.0)
Hemoglobin: 10.7 g/dL — ABNORMAL LOW (ref 12.0–15.0)
MCH: 27.6 pg (ref 26.0–34.0)
MCHC: 31.6 g/dL (ref 30.0–36.0)
MCV: 87.6 fL (ref 80.0–100.0)
Platelets: 356 10*3/uL (ref 150–400)
RBC: 3.87 MIL/uL (ref 3.87–5.11)
RDW: 13.7 % (ref 11.5–15.5)
WBC: 9 10*3/uL (ref 4.0–10.5)
nRBC: 0 % (ref 0.0–0.2)

## 2021-05-14 LAB — COMPREHENSIVE METABOLIC PANEL
ALT: 12 U/L (ref 0–44)
AST: 13 U/L — ABNORMAL LOW (ref 15–41)
Albumin: 4.7 g/dL (ref 3.5–5.0)
Alkaline Phosphatase: 65 U/L (ref 38–126)
Anion gap: 9 (ref 5–15)
BUN: 17 mg/dL (ref 6–20)
CO2: 25 mmol/L (ref 22–32)
Calcium: 10 mg/dL (ref 8.9–10.3)
Chloride: 102 mmol/L (ref 98–111)
Creatinine, Ser: 0.81 mg/dL (ref 0.44–1.00)
GFR, Estimated: >60 mL/min (ref >60)
Glucose, Bld: 83 mg/dL (ref 70–99)
Potassium: 3.7 mmol/L (ref 3.5–5.1)
Sodium: 136 mmol/L (ref 135–145)
Total Bilirubin: 0.8 mg/dL (ref 0.3–1.2)
Total Protein: 8.3 g/dL — ABNORMAL HIGH (ref 6.5–8.1)

## 2021-05-14 LAB — POCT PREGNANCY, URINE: Preg Test, Ur: NEGATIVE

## 2021-05-14 NOTE — ED Triage Notes (Signed)
Patient here POV from Home with ABD Pain.  Patient states she had her 2nd Child approximately 6 months PTA. Patient has since been having ABD Pain and related it to Breastfeeding but since she stopped Breastfeeding 1 Month PTA she continues having ABD Pain.  No Abnormal Bleeding. Mild Nausea. No Vomiting. No Diarrhea. Pain is mainly oriented to the Mid to Right ABD. No Severe Complications during Delivery.  NAD Noted during Triage. A&Ox4. GCS 15. Ambulatory.

## 2021-05-14 NOTE — MAU Provider Note (Signed)
Event Date/Time   First Provider Initiated Contact with Patient 05/14/21 2102      S Ms. [name] is a 35 y.o. [id number] patient who presents to MAU today with complaint of abdominal pain for 5 months since her delivery.  Pain is midline abdomen, and states has a hard painful lump there. Never went for PP check because she was working as a Tour manager in [place]..   O BP 127/78 (BP Location: Right Arm)   Pulse 81   Temp 98.1 F (36.7 C)   Resp 17   Ht 5\' 5"  (1.651 m)   Wt 122.5 kg   LMP 04/21/2021   SpO2 99%   BMI 44.93 kg/m  Physical Exam Constitutional:      General: She is not in acute distress.    Appearance: She is not toxic-appearing.  HENT:     Head: Normocephalic.  Cardiovascular:     Rate and Rhythm: Normal rate.  Pulmonary:     Effort: Pulmonary effort is normal.  Abdominal:     Tenderness: There is abdominal tenderness.     Comments: Firm mass at midline just above umbilicus, tender  Skin:    General: Skin is warm and dry.  Neurological:     General: No focal deficit present.     Mental Status: She is alert.  Results for orders placed or performed during the hospital encounter of 05/14/21 (from the past 24 hour(s))  Pregnancy, urine POC     Status: None   Collection Time: 05/14/21  8:57 PM  Result Value Ref Range   Preg Test, Ur NEGATIVE NEGATIVE     A Medical screening exam complete Tender midline abdominal mass, ?hernia  P Discharge from MAU in stable condition Patient given the option of transfer to [facility] for further evaluation or seek care in outpatient facility of choice  Discussed she may go to ER but there will be a wait time. May go to Urgent Care in AM  List of options for follow-up given  Warning signs for worsening condition that would warrant emergency follow-up discussed Patient may return to MAU as needed   [facility], CNM 05/14/2021 9:03 PM

## 2021-05-14 NOTE — MAU Note (Signed)
I had a baby 2months ago and since then I have been having random abdominal cramping. I thought it may be due to breastfeeding but I stopped breastfeeding a month ago and I continue to have abdominal cramping. My stomach feels tight and feel like there is a bulge in upper abdomen. LMP 04/21/21

## 2021-05-14 NOTE — Progress Notes (Signed)
Wynelle Bourgeois CNM in Triage to discuss d/c plan with pt. Pt then d/c home from Triage by CNM

## 2021-05-14 NOTE — MAU Note (Signed)
Wynelle Bourgeois CNM in Triage to talk with pt and discuss plan of care

## 2021-05-15 ENCOUNTER — Emergency Department (HOSPITAL_BASED_OUTPATIENT_CLINIC_OR_DEPARTMENT_OTHER): Payer: Self-pay

## 2021-05-15 ENCOUNTER — Encounter (HOSPITAL_BASED_OUTPATIENT_CLINIC_OR_DEPARTMENT_OTHER): Payer: Self-pay | Admitting: Radiology

## 2021-05-15 LAB — URINALYSIS, ROUTINE W REFLEX MICROSCOPIC
Bilirubin Urine: NEGATIVE
Glucose, UA: NEGATIVE mg/dL
Hgb urine dipstick: NEGATIVE
Ketones, ur: NEGATIVE mg/dL
Leukocytes,Ua: NEGATIVE
Nitrite: NEGATIVE
Protein, ur: NEGATIVE mg/dL
Specific Gravity, Urine: 1.018 (ref 1.005–1.030)
pH: 5.5 (ref 5.0–8.0)

## 2021-05-15 LAB — PREGNANCY, URINE: Preg Test, Ur: NEGATIVE

## 2021-05-15 MED ORDER — IOHEXOL 300 MG/ML  SOLN
100.0000 mL | Freq: Once | INTRAMUSCULAR | Status: AC | PRN
  Administered 2021-05-15: 100 mL via INTRAVENOUS

## 2021-05-15 MED ORDER — MORPHINE SULFATE (PF) 4 MG/ML IV SOLN
4.0000 mg | Freq: Once | INTRAVENOUS | Status: AC
  Administered 2021-05-15: 4 mg via INTRAVENOUS
  Filled 2021-05-15: qty 1

## 2021-05-15 MED ORDER — DICYCLOMINE HCL 10 MG PO CAPS
10.0000 mg | ORAL_CAPSULE | Freq: Once | ORAL | Status: AC
  Administered 2021-05-15: 10 mg via ORAL
  Filled 2021-05-15: qty 1

## 2021-05-15 NOTE — ED Provider Notes (Signed)
[facility] EMERGENCY DEPT Provider Note   CSN: [id number] Arrival date & time: 05/14/21  2158     History Chief Complaint  Patient presents with   Abdominal Pain    [name] is a 35 y.o. female.  The history is provided by the patient and medical records.  Abdominal Pain [name] is a 35 y.o. female who presents to the Emergency Department complaining of abdominal pain.  She presents to the ED for evaluation of abdominal pain that started the day after she delivered her daughter, April 24.    She took simethicone, which initially helped.  Initially pain was worse with breast feeding.  Has not been breast feeding for the last month but pain has significantly worsened.    Pain is located in the mid right to upper abdomen.  Episodes of pain last several hours.  Unclear if there are triggers, possibly food.  Most recent episode today. She feels like there is a heart not at the area pain at times.  Has mild nausea.  No fever, diarrhea, constipation dysuria, vaginal discharge.  LMP 2 weeks ago.  No prior abdominal surgeries.      Past Medical History:  Diagnosis Date   ADD (attention deficit disorder)    Allergy    RHINITIS   BV (bacterial vaginosis)    Eczema    Headache(784.0)    intermittent   Hypothyroidism    Obesity    UTI (lower urinary tract infection)    not frequently    Patient Active Problem List   Diagnosis Date Noted   SVD (spontaneous vaginal delivery) 11/24/2020   Shoulder dystocia, delivered 11/24/2020   History of herpes genitalis 11/23/2020   Preterm labor 11/23/2020   Large for gestational age fetus affecting management of mother 11/13/2020   [redacted] weeks gestation of pregnancy 11/13/2020   [redacted] weeks gestation of pregnancy 10/13/2020   Gestational diabetes 09/30/2020   Supervision of other normal pregnancy, antepartum 05/19/2020   Anemia, unspecified 12/02/2015   Thyroid activity decreased 12/02/2015   ADD (attention deficit disorder)  12/02/2015   Encounter for health maintenance examination in adult 07/16/2015   Obesity in pregnancy 07/16/2015   Family history of DVT 07/16/2015   Family history of diabetes mellitus 07/16/2015   Anemia, iron deficiency 07/16/2015    Past Surgical History:  Procedure Laterality Date   WISDOM TOOTH EXTRACTION       OB History     Gravida  3   Para  2   Term  1   Preterm  1   AB  1   Living  2      SAB      IAB  1   Ectopic      Multiple  0   Live Births  2           Family History  Problem Relation Age of Onset   Fibromyalgia Mother    Lupus Mother    Obesity Mother    Kidney disease Mother    Deep vein thrombosis Father    Hypertension Father    Diabetes Father    Obesity Father    Asthma Sister    Seizures Sister    Hypertension Maternal Grandmother    Heart disease Maternal Grandmother    Kidney disease Maternal Grandmother    Diabetes Maternal Grandmother    Hypertension Maternal Grandfather    Cancer Maternal Grandfather    Alzheimer's disease Paternal Grandmother    Diabetes Paternal Actor  Social History   Tobacco Use   Smoking status: Never   Smokeless tobacco: Never  Vaping Use   Vaping Use: Never used  Substance Use Topics   Alcohol use: Not Currently    Comment: 1-2 nights weekly   Drug use: No    Home Medications Prior to Admission medications   Medication Sig Start Date End Date Taking? Authorizing Provider  ascorbic acid (VITAMIN C) 500 MG tablet Take one tablet by mouth twice a day every other day with Iron tablet. Patient taking differently: Take 500 mg by mouth See admin instructions. Take one tablet by mouth twice a day every other day with Iron tablet. 09/30/20   Raelyn Mora, CNM  coconut oil OIL Apply 1 application topically as needed. 11/25/20   Maness, Loistine Chance, MD  ferrous sulfate (FERROUSUL) 325 (65 FE) MG tablet Take 1 tablet by mouth twice a day every other day with Vitamin C tablet. Patient  taking differently: Take 325 mg by mouth See admin instructions. Take 1 tablet by mouth twice a day every other day with Vitamin C tablet. 09/30/20   Raelyn Mora, CNM  ibuprofen (ADVIL) 600 MG tablet Take 1 tablet (600 mg total) by mouth every 6 (six) hours. 11/25/20   Maness, Loistine Chance, MD  levothyroxine (SYNTHROID) 50 MCG tablet Take 1 tablet (50 mcg total) by mouth daily before breakfast. 08/08/20   Gerrit Heck, CNM  oxyCODONE (OXY IR/ROXICODONE) 5 MG immediate release tablet Take 1 tablet (5 mg total) by mouth every 4 (four) hours as needed for moderate pain. 11/25/20   Arabella Merles, CNM  Prenatal Vit-Fe Fumarate-FA (M-NATAL PLUS) 27-1 MG TABS TAKE 1 TABLET BY MOUTH DAILY AT 12 NOON. Patient taking differently: Take 1 tablet by mouth daily. 07/01/20   Raelyn Mora, CNM  simethicone (MYLICON) 80 MG chewable tablet Chew 1 tablet (80 mg total) by mouth as needed for flatulence. 11/25/20   Jovita Kussmaul, MD    Allergies    Patient has no known allergies.  Review of Systems   Review of Systems  Gastrointestinal:  Positive for abdominal pain.  All other systems reviewed and are negative.  Physical Exam Updated Vital Signs BP 122/76   Pulse 68   Temp 98.2 F (36.8 C) (Oral)   Resp 18   Ht 5\' 5"  (1.651 m)   Wt 122.5 kg   LMP 04/21/2021 Comment: neg preg test  SpO2 100%   BMI 44.93 kg/m   Physical Exam Vitals and nursing note reviewed.  Constitutional:      Appearance: She is well-developed.  HENT:     Head: Normocephalic and atraumatic.  Cardiovascular:     Rate and Rhythm: Normal rate and regular rhythm.     Heart sounds: No murmur heard. Pulmonary:     Effort: Pulmonary effort is normal. No respiratory distress.     Breath sounds: Normal breath sounds.  Abdominal:     Palpations: Abdomen is soft.     Tenderness: There is abdominal tenderness. There is no guarding or rebound.     Comments: Moderate RUQ tenderness. There is a firm area just superior to the umbilicus that  is tender to palpation without overlying erythema.  Musculoskeletal:        General: No swelling or tenderness.  Skin:    General: Skin is warm and dry.  Neurological:     Mental Status: She is alert and oriented to person, place, and time.  Psychiatric:        Behavior: Behavior  normal.    ED Results / Procedures / Treatments   Labs (all labs ordered are listed, but only abnormal results are displayed) Labs Reviewed  LIPASE, BLOOD - Abnormal; Notable for the following components:      Result Value   Lipase 10 (*)    All other components within normal limits  COMPREHENSIVE METABOLIC PANEL - Abnormal; Notable for the following components:   Total Protein 8.3 (*)    AST 13 (*)    All other components within normal limits  CBC - Abnormal; Notable for the following components:   Hemoglobin 10.7 (*)    HCT 33.9 (*)    All other components within normal limits  URINALYSIS, ROUTINE W REFLEX MICROSCOPIC - Abnormal; Notable for the following components:   Bacteria, UA RARE (*)    All other components within normal limits  PREGNANCY, URINE    EKG None  Radiology CT Abdomen Pelvis W Contrast  Result Date: 05/15/2021 CLINICAL DATA:  Abdominal pain, acute, nonlocalized. EXAM: CT ABDOMEN AND PELVIS WITH CONTRAST TECHNIQUE: Multidetector CT imaging of the abdomen and pelvis was performed using the standard protocol following bolus administration of intravenous contrast. CONTRAST:  OMNIPAQUE IOHEXOL 300 MG/ML  SOLN COMPARISON:  None. FINDINGS: Lower chest: Visualized lung bases are clear. The visualized heart and pericardium are unremarkable. Hepatobiliary: No focal liver abnormality is seen. No gallstones, gallbladder wall thickening, or biliary dilatation. Pancreas: Unremarkable Spleen: Unremarkable Adrenals/Urinary Tract: Adrenal glands are unremarkable. Kidneys are normal, without renal calculi, focal lesion, or hydronephrosis. Bladder is unremarkable. Stomach/Bowel: Stomach is  within normal limits. Appendix appears normal. No evidence of bowel wall thickening, distention, or inflammatory changes. No free air. Vascular/Lymphatic: No significant vascular findings are present. No enlarged abdominal or pelvic lymph nodes. Reproductive: Uterus and bilateral adnexa are unremarkable. Other: A ventral hernia immediately superior to the umbilicus is present containing omental fat which demonstrates soft tissue infiltration and a small amount of fluid suggesting possible incarceration. The hernia defect measures 16 x 18 mm. The hernia sac itself measures roughly 3.3 x 3.6 cm. There is a superimposed small fat containing umbilical hernia. Musculoskeletal: No acute bone abnormality. No lytic or blastic bone lesion. IMPRESSION: Combination umbilical and supraumbilical hernia. There is herniated omental fat within the supraumbilical hernia sac which demonstrates soft tissue infiltration and fluid possibly related to incarceration. Correlation with clinical examination is recommended. No additional acute intra-abdominal pathology identified. Electronically Signed   By: Helyn Numbers M.D.   On: 05/15/2021 01:58   US Abdomen Limited RUQ (LIVER/GB)  Result Date: 05/15/2021 CLINICAL DATA:  Right upper quadrant pain. EXAM: ULTRASOUND ABDOMEN LIMITED RIGHT UPPER QUADRANT COMPARISON:  None. FINDINGS: Gallbladder: A small, ill-defined echogenic area is seen along the gallbladder wall in the region of the gallbladder fundus. There is no evidence of gallbladder wall thickening (2.4 mm). No sonographic Murphy sign noted by sonographer. Common bile duct: Diameter: 3.6 mm Liver: No focal lesion identified. Within normal limits in parenchymal echogenicity. Portal vein is patent on color Doppler imaging with normal direction of blood flow towards the liver. Other: None. IMPRESSION: 1. Echogenic area within the region of the gallbladder fundus which may represent tiny calcifications versus slice thickness  artifact from adjacent bowel gas. 2. Otherwise, unremarkable right upper quadrant ultrasound. Electronically Signed   By: Aram Candela M.D.   On: 05/15/2021 00:57    Procedures Hernia reduction  Date/Time: 05/15/2021 5:10 AM Performed by: Tilden Fossa, MD Authorized by: Tilden Fossa, MD  Consent: Verbal consent  obtained. Patient identity confirmed: verbally with patient Local anesthesia used: no  Anesthesia: Local anesthesia used: no  Sedation: Patient sedated: no  Patient tolerance: patient tolerated the procedure well with no immediate complications Comments: Patient was placed in supine position after administration of pain medications firm and steady pressure was applied to the hernia region with subsequent reduction of the supraumbilical hernia.     Medications Ordered in ED Medications  dicyclomine (BENTYL) capsule 10 mg (10 mg Oral Given 05/15/21 0134)  iohexol (OMNIPAQUE) 300 MG/ML solution 100 mL (100 mLs Intravenous Contrast Given 05/15/21 0142)  morphine 4 MG/ML injection 4 mg (4 mg Intravenous Given 05/15/21 0233)    ED Course  I have reviewed the triage vital signs and the nursing notes.  Pertinent labs & imaging results that were available during my care of the patient were reviewed by me and considered in my medical decision making (see chart for details).    MDM Rules/Calculators/A&P                          patient here for evaluation of abdominal pain that is been present since delivering her daughter five and half months ago. She does have tenderness over the right upper quadrant and a firm area just superior to the umbilicus. Initial concern for possible biliary disease and an ultrasound was obtained. Ultrasound was negative for acute abnormality in a CT abdomen pelvis was obtained. CT is significant for umbilical hernia with focal fluid collection.   After discussion with the patient the hernia was reduced without difficulty. Discussed with  on-call surgeon, Dr. Bedelia Person. Plan to have the patient follow-up soon with surgical team for outpatient repair. Discussed with patient importance of return precautions if she has recurrent persistent pain or recurrent incarceration that she cannot reduce.  Final Clinical Impression(s) / ED Diagnoses Final diagnoses:  RUQ abdominal pain  Umbilical hernia without obstruction and without gangrene    Rx / DC Orders ED Discharge Orders     None        Tilden Fossa, MD 05/15/21 [id number]

## 2021-06-05 ENCOUNTER — Other Ambulatory Visit: Payer: Self-pay

## 2021-06-05 ENCOUNTER — Encounter (HOSPITAL_COMMUNITY): Payer: Self-pay | Admitting: General Surgery

## 2021-06-05 ENCOUNTER — Ambulatory Visit: Payer: Self-pay | Admitting: General Surgery

## 2021-06-05 NOTE — Progress Notes (Signed)
Pt aware of surgical time change. Aware to arrive at [facility] admitting at 0915 on Monday 06/08/2021. No food after midnight; clear liquids from midnight till 0830 then nothing by mouth.

## 2021-06-05 NOTE — Progress Notes (Signed)
COVID swab appointment:  N/A  COVID Vaccine Completed: Yes x2 Date COVID Vaccine completed: Has received booster:  Yes x1 COVID vaccine manufacturer: Pfizer      Date of COVID positive in last 90 days: No  PCP - Currently no PCP Cardiologist - No  Chest x-ray - N/A EKG - N/A Stress Test - N/A ECHO - N/A Cardiac Cath - N/A Pacemaker/ICD device last checked: Spinal Cord Stimulator:  Sleep Study - N/A CPAP -   Fasting Blood Sugar - N/A Checks Blood Sugar _____ times a day  Blood Thinner Instructions:N/A Aspirin Instructions: Last Dose:  Activity level:  Can go up a flight of stairs and perform activities of daily living without stopping and without symptoms of chest pain or shortness of breath.   Able to exercise without symptoms     Anesthesia review: N/A  Patient denies shortness of breath, fever, cough and chest pain at PAT appointment (completed over the phone)   Patient verbalized understanding of instructions that were given to them at the PAT appointment. Patient was also instructed that they will need to review over the PAT instructions again at home before surgery.

## 2021-06-05 NOTE — Progress Notes (Signed)
Requested surgical orders with Adela Lank at Dr. Tawana Scale office.

## 2021-06-08 ENCOUNTER — Inpatient Hospital Stay (HOSPITAL_COMMUNITY)
Admission: RE | Admit: 2021-06-08 | Discharge: 2021-06-10 | DRG: 354 | Disposition: A | Discharge status: Home / Self Care | Payer: Self-pay | Attending: General Surgery | Admitting: General Surgery

## 2021-06-08 ENCOUNTER — Ambulatory Visit (HOSPITAL_COMMUNITY): Payer: Self-pay | Admitting: Certified Registered Nurse Anesthetist

## 2021-06-08 ENCOUNTER — Encounter (HOSPITAL_COMMUNITY)
Admission: RE | Disposition: A | Discharge status: Home / Self Care | Payer: Self-pay | Source: Home / Self Care | Attending: General Surgery

## 2021-06-08 ENCOUNTER — Encounter (HOSPITAL_COMMUNITY): Payer: Self-pay | Admitting: General Surgery

## 2021-06-08 DIAGNOSIS — E079 Disorder of thyroid, unspecified: Secondary | ICD-10-CM | POA: Diagnosis present

## 2021-06-08 DIAGNOSIS — Z6841 Body Mass Index (BMI) 40.0 and over, adult: Secondary | ICD-10-CM

## 2021-06-08 DIAGNOSIS — Z79890 Hormone replacement therapy (postmenopausal): Secondary | ICD-10-CM

## 2021-06-08 DIAGNOSIS — Z833 Family history of diabetes mellitus: Secondary | ICD-10-CM

## 2021-06-08 DIAGNOSIS — K436 Other and unspecified ventral hernia with obstruction, without gangrene: Principal | ICD-10-CM | POA: Diagnosis present

## 2021-06-08 DIAGNOSIS — Z8719 Personal history of other diseases of the digestive system: Secondary | ICD-10-CM

## 2021-06-08 DIAGNOSIS — K429 Umbilical hernia without obstruction or gangrene: Secondary | ICD-10-CM | POA: Diagnosis present

## 2021-06-08 DIAGNOSIS — Z9889 Other specified postprocedural states: Secondary | ICD-10-CM

## 2021-06-08 DIAGNOSIS — Z01818 Encounter for other preprocedural examination: Secondary | ICD-10-CM

## 2021-06-08 HISTORY — PX: VENTRAL HERNIA REPAIR: SHX424

## 2021-06-08 HISTORY — DX: Gestational diabetes mellitus in pregnancy, unspecified control: O24.419

## 2021-06-08 HISTORY — DX: Anemia, unspecified: D64.9

## 2021-06-08 LAB — PREGNANCY, URINE: Preg Test, Ur: NEGATIVE

## 2021-06-08 SURGERY — REPAIR, HERNIA, VENTRAL, LAPAROSCOPIC
Anesthesia: General

## 2021-06-08 MED ORDER — FENTANYL CITRATE (PF) 100 MCG/2ML IJ SOLN
INTRAMUSCULAR | Status: DC | PRN
  Administered 2021-06-08 (×5): 50 ug via INTRAVENOUS

## 2021-06-08 MED ORDER — MIDAZOLAM HCL 2 MG/2ML IJ SOLN
INTRAMUSCULAR | Status: AC
  Filled 2021-06-08: qty 2

## 2021-06-08 MED ORDER — LIDOCAINE HCL (PF) 2 % IJ SOLN
INTRAMUSCULAR | Status: AC
  Filled 2021-06-08: qty 5

## 2021-06-08 MED ORDER — ONDANSETRON HCL 4 MG/2ML IJ SOLN
INTRAMUSCULAR | Status: DC | PRN
  Administered 2021-06-08: 4 mg via INTRAVENOUS

## 2021-06-08 MED ORDER — ONDANSETRON HCL 4 MG/2ML IJ SOLN: 4.0000 mg | Freq: Four times a day (QID) | INTRAMUSCULAR | Status: DC | PRN

## 2021-06-08 MED ORDER — OXYCODONE HCL 5 MG PO TABS
ORAL_TABLET | ORAL | Status: AC
  Administered 2021-06-08: 5 mg via ORAL
  Filled 2021-06-08: qty 1

## 2021-06-08 MED ORDER — METHOCARBAMOL 500 MG IVPB - SIMPLE MED
INTRAVENOUS | Status: AC
  Administered 2021-06-08: 500 mg
  Filled 2021-06-08: qty 50

## 2021-06-08 MED ORDER — DEXAMETHASONE SODIUM PHOSPHATE 10 MG/ML IJ SOLN
INTRAMUSCULAR | Status: AC
  Filled 2021-06-08: qty 1

## 2021-06-08 MED ORDER — ROCURONIUM BROMIDE 10 MG/ML (PF) SYRINGE
PREFILLED_SYRINGE | INTRAVENOUS | Status: AC
  Filled 2021-06-08: qty 10

## 2021-06-08 MED ORDER — PROPOFOL 10 MG/ML IV BOLUS
INTRAVENOUS | Status: AC
  Filled 2021-06-08: qty 20

## 2021-06-08 MED ORDER — OXYCODONE HCL 5 MG PO TABS
5.0000 mg | ORAL_TABLET | ORAL | Status: DC | PRN
  Administered 2021-06-08 – 2021-06-09 (×6): 10 mg via ORAL
  Administered 2021-06-10: 5 mg via ORAL
  Administered 2021-06-10: 10 mg via ORAL
  Filled 2021-06-08 (×2): qty 2
  Filled 2021-06-08: qty 1
  Filled 2021-06-08 (×5): qty 2

## 2021-06-08 MED ORDER — FENTANYL CITRATE PF 50 MCG/ML IJ SOSY
25.0000 ug | PREFILLED_SYRINGE | INTRAMUSCULAR | Status: DC | PRN
  Administered 2021-06-08: 25 ug via INTRAVENOUS
  Administered 2021-06-08: 50 ug via INTRAVENOUS
  Administered 2021-06-08 (×2): 25 ug via INTRAVENOUS

## 2021-06-08 MED ORDER — DEXAMETHASONE SODIUM PHOSPHATE 10 MG/ML IJ SOLN
INTRAMUSCULAR | Status: DC | PRN
  Administered 2021-06-08: 10 mg via INTRAVENOUS

## 2021-06-08 MED ORDER — BUPIVACAINE LIPOSOME 1.3 % IJ SUSP: 20.0000 mL | Freq: Once | INTRAMUSCULAR | Status: DC

## 2021-06-08 MED ORDER — CHLORHEXIDINE GLUCONATE CLOTH 2 % EX PADS: 6.0000 | MEDICATED_PAD | Freq: Once | CUTANEOUS | Status: DC

## 2021-06-08 MED ORDER — METHOCARBAMOL 500 MG PO TABS
500.0000 mg | ORAL_TABLET | Freq: Four times a day (QID) | ORAL | Status: DC | PRN
  Administered 2021-06-08 – 2021-06-10 (×6): 500 mg via ORAL
  Filled 2021-06-08 (×5): qty 1

## 2021-06-08 MED ORDER — DIPHENHYDRAMINE HCL 25 MG PO CAPS
25.0000 mg | ORAL_CAPSULE | Freq: Once | ORAL | Status: AC
  Administered 2021-06-08: 25 mg via ORAL
  Filled 2021-06-08: qty 1

## 2021-06-08 MED ORDER — BUPIVACAINE LIPOSOME 1.3 % IJ SUSP
INTRAMUSCULAR | Status: AC
  Filled 2021-06-08: qty 20

## 2021-06-08 MED ORDER — GABAPENTIN 100 MG PO CAPS: 100.0000 mg | ORAL_CAPSULE | Freq: Three times a day (TID) | ORAL | 0 refills | Status: DC

## 2021-06-08 MED ORDER — HYDRALAZINE HCL 20 MG/ML IJ SOLN: 10.0000 mg | INTRAMUSCULAR | Status: DC | PRN

## 2021-06-08 MED ORDER — BUPIVACAINE-EPINEPHRINE (PF) 0.25% -1:200000 IJ SOLN
INTRAMUSCULAR | Status: AC
  Filled 2021-06-08: qty 30

## 2021-06-08 MED ORDER — IBUPROFEN 600 MG PO TABS: 600.0000 mg | ORAL_TABLET | Freq: Three times a day (TID) | ORAL | 0 refills | Status: AC | PRN

## 2021-06-08 MED ORDER — AMISULPRIDE (ANTIEMETIC) 5 MG/2ML IV SOLN
INTRAVENOUS | Status: AC
  Administered 2021-06-08: 10 mg via INTRAVENOUS
  Filled 2021-06-08: qty 4

## 2021-06-08 MED ORDER — SUGAMMADEX SODIUM 200 MG/2ML IV SOLN
INTRAVENOUS | Status: DC | PRN
Start: 2021-06-08 — End: 2021-06-08
  Administered 2021-06-08: 300 mg via INTRAVENOUS

## 2021-06-08 MED ORDER — DROPERIDOL 2.5 MG/ML IJ SOLN: 0.6250 mg | Freq: Once | INTRAMUSCULAR | Status: DC | PRN

## 2021-06-08 MED ORDER — METHOCARBAMOL 500 MG PO TABS
ORAL_TABLET | ORAL | Status: AC
  Filled 2021-06-08: qty 1

## 2021-06-08 MED ORDER — PANTOPRAZOLE SODIUM 40 MG IV SOLR: 40.0000 mg | Freq: Every day | INTRAVENOUS | Status: DC

## 2021-06-08 MED ORDER — FENTANYL CITRATE PF 50 MCG/ML IJ SOSY
PREFILLED_SYRINGE | INTRAMUSCULAR | Status: AC
  Administered 2021-06-08: 25 ug via INTRAVENOUS
  Filled 2021-06-08: qty 2

## 2021-06-08 MED ORDER — KETOROLAC TROMETHAMINE 30 MG/ML IJ SOLN
INTRAMUSCULAR | Status: AC
  Filled 2021-06-08: qty 1

## 2021-06-08 MED ORDER — ENOXAPARIN SODIUM 40 MG/0.4ML IJ SOSY
40.0000 mg | PREFILLED_SYRINGE | INTRAMUSCULAR | Status: DC
  Administered 2021-06-09 – 2021-06-10 (×2): 40 mg via SUBCUTANEOUS
  Filled 2021-06-08 (×2): qty 0.4

## 2021-06-08 MED ORDER — SIMETHICONE 80 MG PO CHEW: 80.0000 mg | CHEWABLE_TABLET | ORAL | Status: DC | PRN

## 2021-06-08 MED ORDER — MIDAZOLAM HCL 5 MG/5ML IJ SOLN
INTRAMUSCULAR | Status: DC | PRN
  Administered 2021-06-08: 2 mg via INTRAVENOUS

## 2021-06-08 MED ORDER — SCOPOLAMINE 1 MG/3DAYS TD PT72
MEDICATED_PATCH | TRANSDERMAL | Status: AC
  Filled 2021-06-08: qty 1

## 2021-06-08 MED ORDER — OXYCODONE HCL 5 MG PO TABS: 5.0000 mg | ORAL_TABLET | Freq: Once | ORAL | Status: AC | PRN

## 2021-06-08 MED ORDER — OXYCODONE HCL 5 MG PO TABS: 5.0000 mg | ORAL_TABLET | Freq: Four times a day (QID) | ORAL | 0 refills | Status: DC | PRN

## 2021-06-08 MED ORDER — ONDANSETRON HCL 4 MG/2ML IJ SOLN
INTRAMUSCULAR | Status: AC
  Filled 2021-06-08: qty 2

## 2021-06-08 MED ORDER — BUPIVACAINE-EPINEPHRINE 0.25% -1:200000 IJ SOLN
INTRAMUSCULAR | Status: DC | PRN
  Administered 2021-06-08: 30 mL

## 2021-06-08 MED ORDER — SODIUM CHLORIDE 0.9 % IV SOLN: INTRAVENOUS | Status: DC

## 2021-06-08 MED ORDER — KETOROLAC TROMETHAMINE 30 MG/ML IJ SOLN
INTRAMUSCULAR | Status: AC
  Administered 2021-06-08: 30 mg via INTRAVENOUS
  Filled 2021-06-08: qty 1

## 2021-06-08 MED ORDER — LEVOTHYROXINE SODIUM 50 MCG PO TABS
50.0000 ug | ORAL_TABLET | Freq: Every day | ORAL | Status: DC
  Administered 2021-06-09 – 2021-06-10 (×2): 50 ug via ORAL
  Filled 2021-06-08 (×2): qty 1

## 2021-06-08 MED ORDER — KETOROLAC TROMETHAMINE 30 MG/ML IJ SOLN
INTRAMUSCULAR | Status: DC | PRN
  Administered 2021-06-08: 30 mg via INTRAVENOUS

## 2021-06-08 MED ORDER — GABAPENTIN 300 MG PO CAPS
300.0000 mg | ORAL_CAPSULE | ORAL | Status: AC
  Administered 2021-06-08: 300 mg via ORAL
  Filled 2021-06-08: qty 1

## 2021-06-08 MED ORDER — OXYCODONE HCL 5 MG/5ML PO SOLN: 5.0000 mg | Freq: Once | ORAL | Status: AC | PRN

## 2021-06-08 MED ORDER — ACETAMINOPHEN 500 MG PO TABS
1000.0000 mg | ORAL_TABLET | Freq: Four times a day (QID) | ORAL | Status: DC
  Administered 2021-06-08 – 2021-06-10 (×7): 1000 mg via ORAL
  Filled 2021-06-08 (×7): qty 2

## 2021-06-08 MED ORDER — OXYCODONE HCL 5 MG PO TABS: 5.0000 mg | ORAL_TABLET | Freq: Once | ORAL | Status: AC

## 2021-06-08 MED ORDER — BUPIVACAINE LIPOSOME 1.3 % IJ SUSP
INTRAMUSCULAR | Status: DC | PRN
  Administered 2021-06-08: 20 mL

## 2021-06-08 MED ORDER — AMISULPRIDE (ANTIEMETIC) 5 MG/2ML IV SOLN: 10.0000 mg | Freq: Once | INTRAVENOUS | Status: AC

## 2021-06-08 MED ORDER — ORAL CARE MOUTH RINSE: 15.0000 mL | Freq: Once | OROMUCOSAL | Status: AC

## 2021-06-08 MED ORDER — MORPHINE SULFATE (PF) 4 MG/ML IV SOLN
1.0000 mg | INTRAVENOUS | Status: DC | PRN
  Administered 2021-06-08: 2 mg via INTRAVENOUS
  Filled 2021-06-08: qty 1

## 2021-06-08 MED ORDER — DIPHENHYDRAMINE HCL 12.5 MG/5ML PO ELIX: 12.5000 mg | ORAL_SOLUTION | Freq: Four times a day (QID) | ORAL | Status: DC | PRN

## 2021-06-08 MED ORDER — SCOPOLAMINE 1 MG/3DAYS TD PT72
1.0000 | MEDICATED_PATCH | TRANSDERMAL | Status: DC
  Administered 2021-06-08: 1.5 mg via TRANSDERMAL

## 2021-06-08 MED ORDER — ONDANSETRON 4 MG PO TBDP: 4.0000 mg | ORAL_TABLET | Freq: Four times a day (QID) | ORAL | Status: DC | PRN

## 2021-06-08 MED ORDER — KETOROLAC TROMETHAMINE 30 MG/ML IJ SOLN: 30.0000 mg | Freq: Four times a day (QID) | INTRAMUSCULAR | Status: DC

## 2021-06-08 MED ORDER — GABAPENTIN 300 MG PO CAPS
300.0000 mg | ORAL_CAPSULE | Freq: Two times a day (BID) | ORAL | Status: DC
  Administered 2021-06-08 – 2021-06-10 (×4): 300 mg via ORAL
  Filled 2021-06-08 (×4): qty 1

## 2021-06-08 MED ORDER — LACTATED RINGERS IV SOLN: INTRAVENOUS | Status: DC

## 2021-06-08 MED ORDER — PANTOPRAZOLE SODIUM 40 MG PO TBEC
40.0000 mg | DELAYED_RELEASE_TABLET | Freq: Every day | ORAL | Status: DC
  Administered 2021-06-08 – 2021-06-10 (×3): 40 mg via ORAL
  Filled 2021-06-08 (×3): qty 1

## 2021-06-08 MED ORDER — FENTANYL CITRATE (PF) 250 MCG/5ML IJ SOLN
INTRAMUSCULAR | Status: AC
  Filled 2021-06-08: qty 5

## 2021-06-08 MED ORDER — ACETAMINOPHEN 500 MG PO TABS: 1000.0000 mg | ORAL_TABLET | Freq: Three times a day (TID) | ORAL | 0 refills | Status: AC

## 2021-06-08 MED ORDER — LIDOCAINE 2% (20 MG/ML) 5 ML SYRINGE
INTRAMUSCULAR | Status: DC | PRN
  Administered 2021-06-08: 80 mg via INTRAVENOUS

## 2021-06-08 MED ORDER — PROMETHAZINE HCL 25 MG/ML IJ SOLN: 6.2500 mg | INTRAMUSCULAR | Status: DC | PRN

## 2021-06-08 MED ORDER — CEFAZOLIN IN SODIUM CHLORIDE 3-0.9 GM/100ML-% IV SOLN
3.0000 g | INTRAVENOUS | Status: AC
  Administered 2021-06-08: 3 g via INTRAVENOUS
  Filled 2021-06-08: qty 100

## 2021-06-08 MED ORDER — SUGAMMADEX SODIUM 500 MG/5ML IV SOLN
INTRAVENOUS | Status: AC
  Filled 2021-06-08: qty 5

## 2021-06-08 MED ORDER — LACTATED RINGERS IV SOLN
INTRAVENOUS | Status: AC | PRN
  Administered 2021-06-08: 1000 mL

## 2021-06-08 MED ORDER — DIPHENHYDRAMINE HCL 50 MG/ML IJ SOLN: 12.5000 mg | Freq: Four times a day (QID) | INTRAMUSCULAR | Status: DC | PRN

## 2021-06-08 MED ORDER — 0.9 % SODIUM CHLORIDE (POUR BTL) OPTIME
TOPICAL | Status: DC | PRN
  Administered 2021-06-08: 1000 mL

## 2021-06-08 MED ORDER — CHLORHEXIDINE GLUCONATE 0.12 % MT SOLN
15.0000 mL | Freq: Once | OROMUCOSAL | Status: AC
  Administered 2021-06-08: 15 mL via OROMUCOSAL

## 2021-06-08 MED ORDER — PROPOFOL 10 MG/ML IV BOLUS
INTRAVENOUS | Status: DC | PRN
  Administered 2021-06-08: 200 mg via INTRAVENOUS

## 2021-06-08 MED ORDER — FENTANYL CITRATE PF 50 MCG/ML IJ SOSY
PREFILLED_SYRINGE | INTRAMUSCULAR | Status: AC
  Filled 2021-06-08: qty 1

## 2021-06-08 MED ORDER — ROCURONIUM BROMIDE 10 MG/ML (PF) SYRINGE
PREFILLED_SYRINGE | INTRAVENOUS | Status: DC | PRN
  Administered 2021-06-08: 20 mg via INTRAVENOUS
  Administered 2021-06-08: 80 mg via INTRAVENOUS

## 2021-06-08 MED ORDER — POLYETHYLENE GLYCOL 3350 17 G PO PACK: 17.0000 g | PACK | Freq: Every day | ORAL | Status: DC | PRN

## 2021-06-08 MED ORDER — MORPHINE SULFATE (PF) 4 MG/ML IV SOLN
INTRAVENOUS | Status: AC
  Administered 2021-06-08: 2 mg via INTRAVENOUS
  Filled 2021-06-08: qty 1

## 2021-06-08 MED ORDER — ACETAMINOPHEN 500 MG PO TABS
1000.0000 mg | ORAL_TABLET | ORAL | Status: AC
  Administered 2021-06-08: 1000 mg via ORAL
  Filled 2021-06-08: qty 2

## 2021-06-08 MED ORDER — ENSURE PRE-SURGERY PO LIQD
296.0000 mL | Freq: Once | ORAL | Status: DC
  Filled 2021-06-08: qty 296

## 2021-06-08 SURGICAL SUPPLY — 43 items
BENZOIN TINCTURE PRP APPL 2/3 (GAUZE/BANDAGES/DRESSINGS) IMPLANT
BINDER ABDOMINAL 12 ML 46-62 (SOFTGOODS) ×3 IMPLANT
CABLE HIGH FREQUENCY MONO STRZ (ELECTRODE) ×3 IMPLANT
CHLORAPREP W/TINT 26 (MISCELLANEOUS) ×3 IMPLANT
CLOSURE WOUND 1/2 X4 (GAUZE/BANDAGES/DRESSINGS)
DECANTER SPIKE VIAL GLASS SM (MISCELLANEOUS) ×3 IMPLANT
DERMABOND ADVANCED (GAUZE/BANDAGES/DRESSINGS) ×2
DERMABOND ADVANCED .7 DNX12 (GAUZE/BANDAGES/DRESSINGS) ×1 IMPLANT
DEVICE SECURE STRAP 25 ABSORB (INSTRUMENTS) ×6 IMPLANT
DEVICE TROCAR PUNCTURE CLOSURE (ENDOMECHANICALS) ×3 IMPLANT
DRAIN CHANNEL 19F RND (DRAIN) IMPLANT
DRAPE INCISE IOBAN 66X45 STRL (DRAPES) IMPLANT
DRAPE UTILITY XL STRL (DRAPES) IMPLANT
ELECT PENCIL ROCKER SW 15FT (MISCELLANEOUS) IMPLANT
EVACUATOR SILICONE 100CC (DRAIN) IMPLANT
GAUZE 4X4 16PLY ~~LOC~~+RFID DBL (SPONGE) ×3 IMPLANT
GLOVE SRG 8 PF TXTR STRL LF DI (GLOVE) ×1 IMPLANT
GLOVE SURG ENC MOIS LTX SZ7.5 (GLOVE) ×3 IMPLANT
GLOVE SURG UNDER POLY LF SZ8 (GLOVE) ×2
GOWN STRL REUS W/TWL XL LVL3 (GOWN DISPOSABLE) ×3 IMPLANT
KIT BASIN OR (CUSTOM PROCEDURE TRAY) ×3 IMPLANT
KIT TURNOVER KIT A (KITS) ×3 IMPLANT
L-HOOK LAP DISP 36CM (ELECTROSURGICAL)
LHOOK LAP DISP 36CM (ELECTROSURGICAL) IMPLANT
MESH VENTRALIGHT ST 6IN CRC (Mesh General) ×3 IMPLANT
PAD POSITIONING PINK XL (MISCELLANEOUS) IMPLANT
POUCH RETRIEVAL ECOSAC 10 (ENDOMECHANICALS) ×1 IMPLANT
POUCH RETRIEVAL ECOSAC 10MM (ENDOMECHANICALS) ×2
SCISSORS LAP 5X35 DISP (ENDOMECHANICALS) ×3 IMPLANT
SET TUBE SMOKE EVAC HIGH FLOW (TUBING) ×3 IMPLANT
SHEARS HARMONIC ACE PLUS 36CM (ENDOMECHANICALS) IMPLANT
SPONGE T-LAP 18X18 ~~LOC~~+RFID (SPONGE) ×3 IMPLANT
STRIP CLOSURE SKIN 1/2X4 (GAUZE/BANDAGES/DRESSINGS) IMPLANT
SUT ETHILON 2 0 PS N (SUTURE) IMPLANT
SUT MNCRL AB 4-0 PS2 18 (SUTURE) ×3 IMPLANT
SUT NOVA 1 T20/GS 25DT (SUTURE) ×6 IMPLANT
SUT VIC AB 3-0 SH 18 (SUTURE) IMPLANT
TOWEL OR 17X26 10 PK STRL BLUE (TOWEL DISPOSABLE) ×3 IMPLANT
TRAY FOLEY MTR SLVR 16FR STAT (SET/KITS/TRAYS/PACK) IMPLANT
TRAY LAPAROSCOPIC (CUSTOM PROCEDURE TRAY) ×3 IMPLANT
TROCAR BLADELESS OPT 5 100 (ENDOMECHANICALS) ×6 IMPLANT
TROCAR XCEL NON-BLD 11X100MML (ENDOMECHANICALS) IMPLANT
WARMER LAPAROSCOPE (MISCELLANEOUS) ×3 IMPLANT

## 2021-06-08 NOTE — Interval H&P Note (Signed)
History and Physical Interval Note:  06/08/2021 1:34 PM  [name]  has presented today for surgery, with the diagnosis of INCARCERATED VENTRAL HERNIA.  The various methods of treatment have been discussed with the patient and family. After consideration of risks, benefits and other options for treatment, the patient has consented to  Procedure(s): LAPAROSCOPIC ASSISTED REPAIR OF INCARCERATED VENTRAL HERNIA WITH MESH (N/A) as a surgical intervention.  The patient's history has been reviewed, patient examined, no change in status, stable for surgery.  I have reviewed the patient's chart and labs.  Questions were answered to the patient's satisfaction.     [name]

## 2021-06-08 NOTE — Anesthesia Postprocedure Evaluation (Signed)
Anesthesia Post Note  Patient: [name]  Procedure(s) Performed: LAPAROSCOPIC ASSISTED REPAIR OF INCARCERATED VENTRAL HERNIA WITH MESH     Patient location during evaluation: PACU Anesthesia Type: General Level of consciousness: awake Pain management: pain level controlled Vital Signs Assessment: post-procedure vital signs reviewed and stable Respiratory status: spontaneous breathing and respiratory function stable Cardiovascular status: stable Postop Assessment: no apparent nausea or vomiting Anesthetic complications: no   No notable events documented.  Last Vitals:  Vitals:   06/08/21 1600 06/08/21 1615  BP: (!) 162/104 (!) 154/85  Pulse: 80 78  Resp: 15 13  Temp:    SpO2: 100% 99%    Last Pain:  Vitals:   06/08/21 1625  TempSrc:   PainSc: 6                  Candra R Hadessah Grennan

## 2021-06-08 NOTE — Anesthesia Preprocedure Evaluation (Signed)
Anesthesia Evaluation  Patient identified by MRN, date of birth, ID band Patient awake    Reviewed: Allergy & Precautions, NPO status , Patient's Chart, lab work & pertinent test results  Airway Mallampati: II  TM Distance: >3 FB Neck ROM: Full    Dental no notable dental hx.    Pulmonary    Pulmonary exam normal breath sounds clear to auscultation       Cardiovascular Exercise Tolerance: Good negative cardio ROS Normal cardiovascular exam Rhythm:Regular Rate:Normal     Neuro/Psych  Headaches,    GI/Hepatic negative GI ROS, Neg liver ROS,   Endo/Other  diabetesHypothyroidism Morbid obesity  Renal/GU negative Renal ROS  negative genitourinary   Musculoskeletal negative musculoskeletal ROS (+)   Abdominal   Peds negative pediatric ROS (+)  Hematology negative hematology ROS (+)   Anesthesia Other Findings   Reproductive/Obstetrics negative OB ROS                             Anesthesia Physical Anesthesia Plan  ASA: 2  Anesthesia Plan: General   Post-op Pain Management:    Induction: Intravenous  PONV Risk Score and Plan: 3 and Treatment may vary due to age or medical condition, Midazolam, Scopolamine patch - Pre-op, Ondansetron and Dexamethasone  Airway Management Planned: Oral ETT  Additional Equipment: None  Intra-op Plan:   Post-operative Plan: Extubation in OR  Informed Consent: I have reviewed the patients History and Physical, chart, labs and discussed the procedure including the risks, benefits and alternatives for the proposed anesthesia with the patient or authorized representative who has indicated his/her understanding and acceptance.     Dental advisory given  Plan Discussed with: Anesthesiologist and CRNA  Anesthesia Plan Comments:        Anesthesia Quick Evaluation

## 2021-06-08 NOTE — Transfer of Care (Signed)
Immediate Anesthesia Transfer of Care Note  Patient: [name]  Procedure(s) Performed: LAPAROSCOPIC ASSISTED REPAIR OF INCARCERATED VENTRAL HERNIA WITH MESH  Patient Location: PACU  Anesthesia Type:General  Level of Consciousness: awake, alert  and oriented  Airway & Oxygen Therapy: Patient Spontanous Breathing and Patient connected to face mask oxygen  Post-op Assessment: Report given to RN and Post -op Vital signs reviewed and stable  Post vital signs: Reviewed and stable  Last Vitals:  Vitals Value Taken Time  BP 141/119 06/08/21 1547  Temp    Pulse 96 06/08/21 1549  Resp 27 06/08/21 1549  SpO2 100 % 06/08/21 1549  Vitals shown include unvalidated device data.  Last Pain:  Vitals:   06/08/21 1243  TempSrc:   PainSc: 0-No pain         Complications: No notable events documented.

## 2021-06-08 NOTE — H&P (Signed)
DOB: [birth year] DATE OF ENCOUNTER: 05/29/2021  Subjective   Chief Complaint: Hernia   History of Present Illness: [name] is a 35 y.o. female who is seen today as an office consultation at the request of Dr. Bedelia Person for evaluation of Hernia .  She reports ever since she gave birth to her most recent child a few months ago she has been having intermittent severe abdominal cramps. She points to above her bellybutton and slightly to the right. Its not constant. It comes and goes. It can occur after eating certain foods. It will generally last hours. Sometimes simethicone will help relieve the discomfort. Some occasional nausea no vomiting. Has a bowel movement at least every other day. No prior abdominal surgery. Now she is having some occasional discomfort underneath her left breast. She went to the emergency room in October 12. She had an ultrasound and CT scan. There was a question of an gallstone. A CT scan did demonstrate an incarcerated fat-containing sort of supraumbilical hernia. She also had a small fascial defect umbilicus. The total vertical dimension of both hernias was 4.5 with a width of 1.5 cm. She does not smoking. No tobacco use. Possible planning future pregnancies but none in the immediate future.  Review of Systems: A complete review of systems was obtained from the patient. I have reviewed this information and discussed as appropriate with the patient. See HPI as well for other ROS.  ROS   Medical History: Past Medical History:  Diagnosis Date   Thyroid disease   There is no problem list on file for this patient.  History reviewed. No pertinent surgical history.   No Known Allergies  Current Outpatient Medications on File Prior to Visit  Medication Sig Dispense Refill   dextroamphetamine-amphetamine (ADDERALL XR) 15 MG XR capsule Take by mouth   levothyroxine (SYNTHROID) 50 MCG tablet Take by mouth   No current facility-administered medications on file prior to  visit.   Family History  Problem Relation Age of Onset   Obesity Mother   Obesity Father   Diabetes Father   Deep vein thrombosis (DVT or abnormal blood clot formation) Father    Social History   Tobacco Use  Smoking Status Never Smoker  Smokeless Tobacco Never Used    Social History   Socioeconomic History   Marital status: Single  Tobacco Use   Smoking status: Never Smoker   Smokeless tobacco: Never Used  Substance and Sexual Activity   Alcohol use: Never   Drug use: Never   Objective:   Vitals:  05/29/21 1400  BP: 118/76  Pulse: 94  Temp: 36.7 C (98 F)  SpO2: 98%  Weight: (!) 123.5 kg (272 lb 3.2 oz)  Height: 165.1 cm (5\' 5" )   Body mass index is 45.3 kg/m.  Constitutional: NAD; conversant; no deformities; severe obesity Eyes: Moist conjunctiva; no lid lag; anicteric; PERRL Neck: Trachea midline; no thyromegaly Lungs: Normal respiratory effort; no tactile fremitus CV: RRR; no palpable thrills; no pitting edema GI: Abd patient examined supine and standing. The bulge is more noticeable with her standing. Palpable bulge above and slightly to the right of the umbilicus. Not reducible.; no palpable hepatosplenomegaly MSK: Normal gait; no clubbing/cyanosis Psychiatric: Appropriate affect; alert and oriented x3 Lymphatic: No palpable cervical or axillary lymphadenopathy Skin: No rash, lesions or jaundice  Labs, Imaging and Diagnostic Testing: Personally reviewed her CT scan. She has a small fascial defect of of her umbilicus and a larger fascial defect above the umbilicus. It contains fat.  Vertical defect measures 4.5 cm horizontal defect with this about 1.5 cm  Assessment and Plan:  Diagnoses and all orders for this visit:  Incarcerated ventral hernia    We discussed the etiology of ventral hernias. We discussed the signs and symptoms of incarceration and strangulation. The patient was given educational material. I also drew diagrams.  We discussed  nonoperative and operative management. With respect to operative management, we discussed both open repair and laparoscopic repair and laparoscopic assisted (primary muscle repair with mesh underlay). We discussed the pros and cons of each approach. I discussed the typical aftercare with each procedure and how each procedure differs.  We discussed the risk and benefits of surgery including but not limited to bleeding, infection, injury to surrounding structures, hernia recurrence, mesh complications, hematoma/seroma formation, need to convert to an open procedure, blood clot formation, urinary retention, post operative ileus, general anesthesia risk, long-term abdominal pain. We discussed that this procedure can be quite uncomfortable and difficult to recover from based on how the mesh is secured to the abdominal wall. We discussed the importance of avoiding heavy lifting and straining for a period of 6 weeks. We discussed postoperative pain control with nonopioid analgesia. We discussed the typical recovery.  The patient has elected to proceed with laparoscopic assisted repair of incarcerated ventral hernia with mesh.  We did discuss that she is at increased risk for recurrence because of her current BMI however she is fairly symptomatic. We also discussed future implications for future pregnancies. I think if we did a primary muscle repair her risk of recurrence is over 50% so therefore I think we need to use mesh.  I showed the patient her CT scan picture  This patient encounter took 35 minutes today to perform the following: take history, perform exam, review outside records, interpret imaging, counsel the patient on their diagnosis and document encounter, findings & plan in the EHR  No follow-ups on file.  Keyah Blizard Sherril Cong, MD  General, Minimally Invasive, & Bariatric Surgery

## 2021-06-08 NOTE — Op Note (Addendum)
[name] [id number] [birth year] 06/08/2021   Laparoscopic Assisted Repair of Incarcerated Ventral Hernia & Umbilica hernia with Mesh Procedure Note Laparoscopic bilateral TAP block  Indications: Symptomatic incarcerated fat-containing ventral hernia and umbilical hernia.  Patient has a epigastric fat-containing incarcerated ventral hernia and a smaller nonincarcerated umbilical hernia.  There is only about 1 cm of intact fascia between the 2 defects on preoperative imaging.  Recommended to her repair both since they were so close to 1 another.  Did have conversation about increased risk for recurrence given her class III obesity and potential ramifications for additional childbirth.  Pre-operative Diagnosis: incarcerated fat-containing ventral hernia and umbilical hernia  Post-operative Diagnosis: Same  Surgeon: [name] MD FACS  Assistants: none  Anesthesia: General endotracheal anesthesia   Procedure Details  The patient was seen in the Holding Room. The risks, benefits, complications, treatment options, and expected outcomes were discussed with the patient. The possibilities of reaction to medication, pulmonary aspiration, perforation of viscus, bleeding, recurrent infection, the need for additional procedures, failure to diagnose a condition, and creating a complication requiring transfusion or operation were discussed with the patient. The patient concurred with the proposed plan, giving informed consent.  The site of surgery properly noted/marked. The patient was taken to the operating room, identified as [name]  and the procedure verified as laparoscopic assisted repair of incarcerated ventral hernia with mesh. A Time Out was held and the above information confirmed.    The patient was placed supine.  After establishing general anesthesia, a Foley catheter was placed.  Left arm tucked with the appropriate padding.  The abdomen was prepped with Chloraprep and draped in standard  fashion.  A 5 mm Optiview was used the cannulate the peritoneal cavity in the left upper quadrant below the costal margin.  Pneumoperitoneum was obtained by insufflating CO2, maintaining a maximum pressure of 15 mmHg.  The 5 mm 30-degree laparoscopic was inserted.  There were no significant omental adhesions to the anterior abdominal wall in and around the hernia defect.  The patient had an incarcerated fat-containing epigastric hernia with about 1 cm of intact fascia before a fascial defect at the umbilicus.  An 11 mm trocar was placed in the left lateral abdominal wall under direct visualization.  Another 5-mm port was placed in the left lower lateral abdominal wall.  I first took down the falciform ligament with a combination of [place] with cautery as well as EndoShears with cautery.  There is a herniated piece of preperitoneal epigastric fat which was incarcerated.  This was reduced using traction and countertraction.  There was some bleeding from an abdominal wall vessel which was taking care of with [facility] with electrocautery.  I mobilized a small piece of fat from the umbilical defect as well.  There were 2 fascial defects in the abdominal wall.  1 in the epigastric location followed by 1 at the umbilical location.  There was 1 cm of intact fascia separating the 2 defects.  The total defects vertical dimension was approximately 5 cm with a maximal width of 2 cm.  I made a small stab incision directly over the skin in the epigastric location.  Then using a Endo Close with a 0 Novafil I placed 3 interrupted sutures above and below the epigastric defect and reapproximated and closed it in a transverse manner.  A separate small stab incision was made directly over the umbilical skin and then I closed the umbilical fascial defect which was only about 1 cm  with a single horizontal 0 Novafil using the Endo Close suture passer.  Thus all the fascial defects were primarily closed at this  time point.  Measuring 5 cm in all directions this gave me about the need for a 15.2 cm piece of mesh.  I therefore selected a round Bard ventral light ST 15 cm mesh.  A 0 Novafil  IWe placed 2 stay sutures of 1-0 Novofil around the edges of the mesh at the superior and inferior end of the mesh.  The mesh was then rolled up and inserted through the 11 millimeter trocar.   I then returned laparoscopically.  I then infiltrated Exparel in a regional fashion in the preperitoneal space around the umbilical fascial closure and around the expected locations of where the transfascial sutures & tacks would be placed.  I also performed a bilateral laparoscopic tap block for postoperative pain relief. The mesh was then unrolled.  The stay sutures were then pulled up through small stab incisions using the Endo-close device.  This deployed the mesh widely over the closed fascial defects.  The stay sutures were then tied down.  The Secure Strap device was then used to tack down the edges of the mesh at 1 cm intervals circumferentially. We placed a few tacks inside the outer ring of tacks.  We inspected for hemostasis.  There had been some oozing from the right side of the abdominal wall but it had stopped after additional reinspection a few minutes later.  The reduced incarcerated preperitoneal fat was extracted through a specimen bag through the 11 mm trocar site.  The 11 mm trocar site was closed with an interrupted 0 Vicryl using an Endo Close suture passer.  Some additional local was infiltrated in this area.  Pneumoperitoneum was then released as we removed the remainder of the trocars.     The port sites  with 4-0 Monocryl.  All of the incisions and stay suture sites were then covered with dermabond.  An abdominal binder was placed around the patient's abdomen.  Foley removed. The patient was extubated and brought to the recovery room in stable condition. Abdominal binder placed All sponge, instrument, and needle  counts were correct prior to closure and at the conclusion of the case.   Findings: Type of repair - primary suture with mesh underlay Name of mesh - Bard VentralightST  Size of mesh - Round 6 in (15.2cm)  Mesh overlap - >5 cm  Placement of mesh - beneath fascia and into peritoneal cavity,  Estimated Blood Loss:  Minimal         Complications:  None; patient tolerated the procedure well.         Disposition: PACU - hemodynamically stable.         Condition: stable  Mary Sella. Andrey Campanile, MD, FACS General, Bariatric, & Minimally Invasive Surgery [facility], [place]

## 2021-06-08 NOTE — Brief Op Note (Signed)
06/08/2021  3:54 PM  PATIENT:  Billie Ruddy  35 y.o. female  PRE-OPERATIVE DIAGNOSIS:  INCARCERATED VENTRAL HERNIA  POST-OPERATIVE DIAGNOSIS:  INCARCERATED VENTRAL & UMBILICAL HERNIA  PROCEDURE:  Procedure(s): LAPAROSCOPIC ASSISTED REPAIR OF INCARCERATED VENTRAL  & UMBILICAL HERNIA WITH MESH (N/A) LAPAROSCOPIC BILATERAL TAP BLOCK  SURGEON:  Surgeon(s) and Role:    [name], MD - Primary  PHYSICIAN ASSISTANT: NONE  ASSISTANTS: none   ANESTHESIA:   general  EBL:  25 mL   BLOOD ADMINISTERED:none  DRAINS: none   LOCAL MEDICATIONS USED:  MARCAINE    and OTHER EXPAREL  SPECIMEN:  Source of Specimen:  HERNIATED FAT  DISPOSITION OF SPECIMEN:   DISCARDED  COUNTS:  YES  TOURNIQUET:  * No tourniquets in log *  DICTATION: .Dragon Dictation  PLAN OF CARE: Discharge to home after PACU  PATIENT DISPOSITION:  PACU - hemodynamically stable.   Delay start of Pharmacological VTE agent (>24hrs) due to surgical blood loss or risk of bleeding: not applicable  Mary Sella. Andrey Campanile, MD, FACS General, Bariatric, & Minimally Invasive Surgery [facility], [place]

## 2021-06-08 NOTE — Anesthesia Procedure Notes (Signed)
Procedure Name: Intubation Date/Time: 06/08/2021 2:12 PM Performed by: Maxwell Caul, CRNA Pre-anesthesia Checklist: Patient identified, Emergency Drugs available, Suction available and Patient being monitored Patient Re-evaluated:Patient Re-evaluated prior to induction Oxygen Delivery Method: Circle system utilized Preoxygenation: Pre-oxygenation with 100% oxygen Induction Type: IV induction Ventilation: Mask ventilation without difficulty Laryngoscope Size: Mac and 4 Grade View: Grade I Tube type: Oral Tube size: 7.5 mm Number of attempts: 1 Airway Equipment and Method: Stylet Placement Confirmation: ETT inserted through vocal cords under direct vision, positive ETCO2 and breath sounds checked- equal and bilateral Secured at: 22 cm Tube secured with: Tape Dental Injury: Teeth and Oropharynx as per pre-operative assessment

## 2021-06-08 NOTE — Discharge Instructions (Signed)
[facility] LAPAROSCOPIC SURGERY: POST OP INSTRUCTIONS Always review your discharge instruction sheet given to you by the facility where your surgery was performed. IF YOU HAVE DISABILITY OR FAMILY LEAVE FORMS, YOU MUST BRING THEM TO THE OFFICE FOR PROCESSING.   DO NOT GIVE THEM TO YOUR DOCTOR.  PAIN CONTROL  First take acetaminophen (Tylenol) AND/or ibuprofen (Advil) to control your pain after surgery.  Follow directions on package.  Taking acetaminophen (Tylenol) and/or ibuprofen (Advil) regularly after surgery will help to control your pain and lower the amount of prescription pain medication you may need.  You should not take more than 3,000 mg (3 grams) of acetaminophen (Tylenol) in 24 hours.  You should not take ibuprofen (Advil), aleve, motrin, naprosyn or other NSAIDS if you have a history of stomach ulcers or chronic kidney disease.  A prescription for pain medication may be given to you upon discharge.  Take your pain medication as prescribed, if you still have uncontrolled pain after taking acetaminophen (Tylenol) or ibuprofen (Advil). Use ice packs to help control pain. If you need a refill on your pain medication, please contact your pharmacy.  They will contact our office to request authorization. Prescriptions will not be filled after 5pm or on week-ends.  HOME MEDICATIONS Take your usually prescribed medications unless otherwise directed.  DIET You should follow a light diet the first few days after arrival home.  Be sure to include lots of fluids daily. Avoid fatty, fried foods.   CONSTIPATION It is common to experience some constipation after surgery and if you are taking pain medication.  Increasing fluid intake and taking a stool softener (such as Colace) will usually help or prevent this problem from occurring.  A mild laxative (Milk of Magnesia or Miralax) should be taken according to package instructions if there are no bowel movements after 48  hours.  WOUND/INCISION CARE Most patients will experience some swelling and bruising in the area of the incisions.  Ice packs will help.  Swelling and bruising can take several days to resolve.  Unless discharge instructions indicate otherwise, follow guidelines below  STERI-STRIPS - you may remove your outer bandages 48 hours after surgery, and you may shower at that time.  You have steri-strips (small skin tapes) in place directly over the incision.  These strips should be left on the skin for 7-10 days.   DERMABOND/SKIN GLUE - you may shower in 24 hours.  The glue will flake off over the next 2-3 weeks. Any sutures or staples will be removed at the office during your follow-up visit.  ACTIVITIES You may resume regular (light) daily activities beginning the next day--such as daily self-care, walking, climbing stairs--gradually increasing activities as tolerated.  You may have sexual intercourse when it is comfortable.  Refrain from any heavy lifting or straining until approved by your doctor. You may drive when you are no longer taking prescription pain medication, you can comfortably wear a seatbelt, and you can safely maneuver your car and apply brakes.  FOLLOW-UP You should see your doctor in the office for a follow-up appointment approximately 2-3 weeks after your surgery.  You should have been given your post-op/follow-up appointment when your surgery was scheduled.  If you did not receive a post-op/follow-up appointment, make sure that you call for this appointment within a day or two after you arrive home to insure a convenient appointment time.  OTHER INSTRUCTIONS WEAR ABDOMINAL BINDER DURING DAYTIME - WILL HELP WITH PAIN  WHEN TO CALL  YOUR DOCTOR: Fever over 101.0 Inability to urinate Continued bleeding from incision. Increased pain, redness, or drainage from the incision. Increasing abdominal pain  The clinic staff is available to answer your questions during regular business  hours.  Please don't hesitate to call and ask to speak to one of the nurses for clinical concerns.  If you have a medical emergency, go to the nearest emergency room or call 911.  A surgeon from [facility] is always on call at the hospital. [address], [place], [place], [place]  [address] ? [address], [place], [place]   [address] [phone] ? [phone] ? FAX [phone] Web site: [email or web address]

## 2021-06-09 ENCOUNTER — Encounter (HOSPITAL_COMMUNITY): Payer: Self-pay | Admitting: General Surgery

## 2021-06-09 ENCOUNTER — Other Ambulatory Visit: Payer: Self-pay

## 2021-06-09 LAB — CBC
HCT: 33.5 % — ABNORMAL LOW (ref 36.0–46.0)
Hemoglobin: 10.3 g/dL — ABNORMAL LOW (ref 12.0–15.0)
MCH: 27.7 pg (ref 26.0–34.0)
MCHC: 30.7 g/dL (ref 30.0–36.0)
MCV: 90.1 fL (ref 80.0–100.0)
Platelets: 314 10*3/uL (ref 150–400)
RBC: 3.72 MIL/uL — ABNORMAL LOW (ref 3.87–5.11)
RDW: 13.7 % (ref 11.5–15.5)
WBC: 9.6 10*3/uL (ref 4.0–10.5)
nRBC: 0 % (ref 0.0–0.2)

## 2021-06-09 MED ORDER — IBUPROFEN 400 MG PO TABS
600.0000 mg | ORAL_TABLET | Freq: Three times a day (TID) | ORAL | Status: DC
  Administered 2021-06-09: 600 mg via ORAL
  Filled 2021-06-09: qty 1

## 2021-06-09 MED ORDER — KETOROLAC TROMETHAMINE 30 MG/ML IJ SOLN
30.0000 mg | Freq: Four times a day (QID) | INTRAMUSCULAR | Status: DC
  Administered 2021-06-09 – 2021-06-10 (×3): 30 mg via INTRAVENOUS
  Filled 2021-06-09 (×3): qty 1

## 2021-06-09 MED ORDER — IBUPROFEN 400 MG PO TABS: 600.0000 mg | ORAL_TABLET | Freq: Three times a day (TID) | ORAL | Status: DC

## 2021-06-09 MED ORDER — PHENOL 1.4 % MT LIQD
1.0000 | OROMUCOSAL | Status: DC | PRN
  Filled 2021-06-09: qty 177

## 2021-06-09 MED ORDER — KETOROLAC TROMETHAMINE 30 MG/ML IJ SOLN
30.0000 mg | Freq: Three times a day (TID) | INTRAMUSCULAR | Status: DC
  Administered 2021-06-09: 30 mg via INTRAVENOUS
  Filled 2021-06-09: qty 1

## 2021-06-09 MED ORDER — KETOROLAC TROMETHAMINE 30 MG/ML IJ SOLN: 30.0000 mg | Freq: Three times a day (TID) | INTRAMUSCULAR | Status: DC

## 2021-06-09 NOTE — Plan of Care (Signed)
Plan of care initiated and discussed. 

## 2021-06-09 NOTE — Progress Notes (Signed)
1 Day Post-Op   Subjective/Chief Complaint: Fair amount of soreness discomfort with movment No n/v Tolerating liquids Throat sore as well NT at bedside   Objective: Vital signs in last 24 hours: Temp:  [97.6 F (36.4 C)-98.8 F (37.1 C)] 98 F (36.7 C) (11/08 1312) Pulse Rate:  [70-99] 70 (11/08 1312) Resp:  [12-23] 18 (11/08 1312) BP: (128-176)/(78-111) 140/84 (11/08 1312) SpO2:  [91 %-100 %] 100 % (11/08 1312) Last BM Date: 06/07/21  Intake/Output from previous day: 11/07 0701 - 11/08 0700 In: 2062.6 [P.O.:480; I.V.:1482.6; IV Piggyback:100] Out: 175 [Urine:150; Blood:25] Intake/Output this shift: Total I/O In: 240 [P.O.:240] Out: -   Alert, getting out of bed Nontoxic Symm chest rise, nonlabored Not tachy Obese, soft, incisions ok; expected mild TTP, + binder  Lab Results:  Recent Labs    06/09/21 0308  WBC 9.6  HGB 10.3*  HCT 33.5*  PLT 314   BMET No results for input(s): NA, K, CL, CO2, GLUCOSE, BUN, CREATININE, CALCIUM in the last 72 hours. PT/INR No results for input(s): LABPROT, INR in the last 72 hours. ABG No results for input(s): PHART, HCO3 in the last 72 hours.  Invalid input(s): PCO2, PO2  Studies/Results: No results found.  Anti-infectives: Anti-infectives (From admission, onward)    Start     Dose/Rate Route Frequency Ordered Stop   06/08/21 1215  ceFAZolin (ANCEF) IVPB 3g/100 mL premix        3 g 200 mL/hr over 30 Minutes Intravenous On call to O.R. 06/08/21 1206 06/08/21 1435       Assessment/Plan: s/p Procedure(s): LAPAROSCOPIC ASSISTED REPAIR OF INCARCERATED VENTRAL HERNIA WITH MESH (N/A)  Labs ok Lost IV last night Re-establish IV- IV toradol Cont multi modal pain regimen - scheduled tylenol, NSAID, gabapentin, PRN oxy, muscle relaxant, IV pain Oob, ambulate Adv diet Cont chemical vte prophylaxis Re-discussed with pt pain mgmt expectations with this type of surgery and multi-modality pain control Stool softner, prn  miralax  Discussed with nurse just a short time ago - pain improved, but not ready for dc just yet due to pain  Will aim for DC in the AM Chloraseptic spray  LOS: 0 days    [name] 06/09/2021

## 2021-06-09 NOTE — Progress Notes (Signed)
Chaplain received a community referral for [employer].  When chaplain arrived, Eri was sitting with her pastor who had come to visit her.  She was so grateful for the visit.  Chaplain Dyanne Carrel, Bcc Pager, [phone] 2:36 PM

## 2021-06-10 DIAGNOSIS — Z9889 Other specified postprocedural states: Secondary | ICD-10-CM

## 2021-06-10 DIAGNOSIS — Z8719 Personal history of other diseases of the digestive system: Secondary | ICD-10-CM

## 2021-06-10 LAB — CBC
HCT: 32.6 % — ABNORMAL LOW (ref 36.0–46.0)
Hemoglobin: 9.8 g/dL — ABNORMAL LOW (ref 12.0–15.0)
MCH: 27.8 pg (ref 26.0–34.0)
MCHC: 30.1 g/dL (ref 30.0–36.0)
MCV: 92.6 fL (ref 80.0–100.0)
Platelets: 312 10*3/uL (ref 150–400)
RBC: 3.52 MIL/uL — ABNORMAL LOW (ref 3.87–5.11)
RDW: 13.9 % (ref 11.5–15.5)
WBC: 6.8 10*3/uL (ref 4.0–10.5)
nRBC: 0 % (ref 0.0–0.2)

## 2021-06-10 LAB — BASIC METABOLIC PANEL
Anion gap: 6 (ref 5–15)
BUN: 12 mg/dL (ref 6–20)
CO2: 27 mmol/L (ref 22–32)
Calcium: 9 mg/dL (ref 8.9–10.3)
Chloride: 106 mmol/L (ref 98–111)
Creatinine, Ser: 0.87 mg/dL (ref 0.44–1.00)
GFR, Estimated: >60 mL/min (ref >60)
Glucose, Bld: 90 mg/dL (ref 70–99)
Potassium: 3.9 mmol/L (ref 3.5–5.1)
Sodium: 139 mmol/L (ref 135–145)

## 2021-06-10 MED ORDER — METHOCARBAMOL 500 MG PO TABS
500.0000 mg | ORAL_TABLET | Freq: Four times a day (QID) | ORAL | 0 refills | Status: DC | PRN
Start: 2021-06-10 — End: 2022-04-27

## 2021-06-10 MED ORDER — POLYETHYLENE GLYCOL 3350 17 G PO PACK: 17.0000 g | PACK | Freq: Every day | ORAL | 0 refills | Status: DC | PRN

## 2021-06-10 NOTE — Plan of Care (Signed)
  Problem: Clinical Measurements: Goal: Ability to maintain clinical measurements within normal limits will improve Outcome: Progressing   Problem: Activity: Goal: Risk for activity intolerance will decrease Outcome: Progressing   Problem: Pain Managment: Goal: General experience of comfort will improve Outcome: Progressing   

## 2021-06-10 NOTE — Plan of Care (Signed)
Plan of care reviewed with the patient. 

## 2021-06-10 NOTE — Plan of Care (Signed)
Discharge instructions given to the patient. °

## 2021-06-11 NOTE — Discharge Summary (Signed)
Physician Discharge Summary  [name] PXT:[id number] DOB: 09/06/1985 DOA: 06/08/2021  PCP: Harlin Heys, PA-C  Admit date: 06/08/2021 Discharge date: 06/10/2021  Recommendations for Outpatient Follow-up:     Follow-up Information     [name], MD. Schedule an appointment as soon as possible for a visit in 4 week(s).   Specialty: General Surgery Why: For wound re-check Contact information: [address] [place] [address] [phone]                Discharge Diagnoses:  S/p Laparoscopic Assisted Repair of Incarcerated Ventral Hernia & Umbilica hernia with Mesh Procedure Note; Laparoscopic bilateral TAP block Severe obesity  Surgical Procedure: Laparoscopic Assisted Repair of Incarcerated Ventral Hernia & Umbilica hernia with Mesh Procedure Note Laparoscopic bilateral TAP block  Discharge Condition: good  Disposition: home  Diet recommendation: regular  Filed Weights   06/05/21 1030 06/08/21 1303  Weight: 122.5 kg 122.5 kg     Hospital Course:  Patient was brought in for planned repair of an incarcerated ventral epigastric hernia with concomitant umbilical hernia repair with mesh.  She was kept initially overnight for observation for pain control.  On postoperative day 1 her pain was uncontrolled despite multimodality pain control.  She was continued on scheduled Tylenol, gabapentin as needed Robaxin and as needed oxycodone.  IV Toradol was added when she regained IV access.  Her diet was gradually advanced.  On postoperative day 2 she was feeling a lot better.  Her pain was much better controlled.  She was ambulating with less discomfort.  She had no nausea vomiting.  She was tolerating a diet.  Her vital signs were stable.  We discussed discharge instructions.  I felt she was stable for discharge  BP (!) 143/73 (BP Location: Left Arm)   Pulse 63   Temp 98.2 F (36.8 C) (Oral)   Resp 18   Ht 5' 5.5" (1.664 m)   Wt 122.5 kg   LMP  05/18/2021 (Approximate) Comment: urine preg.pending,06/08/21  SpO2 100%   Breastfeeding No   BMI 44.26 kg/m   Gen: alert, NAD, non-toxic appearing Pupils: equal, no scleral icterus Pulm: Lungs clear to auscultation, symmetric chest rise CV: regular rate and rhythm Abd: soft, mild approp tender, nondistended. Some bruising at inf transfascial site.  No cellulitis. No incisional hernia Ext: no edema, no calf tenderness Skin: no rash, no jaundice    Discharge Instructions  Discharge Instructions     Call MD for:   Complete by: As directed    Temperature >101   Call MD for:  hives   Complete by: As directed    Call MD for:  persistant dizziness or light-headedness   Complete by: As directed    Call MD for:  persistant nausea and vomiting   Complete by: As directed    Call MD for:  redness, tenderness, or signs of infection (pain, swelling, redness, odor or green/yellow discharge around incision site)   Complete by: As directed    Call MD for:  severe uncontrolled pain   Complete by: As directed    Diet general   Complete by: As directed    Discharge instructions   Complete by: As directed    See [facility] discharge instructions   Discharge patient   Complete by: As directed    Discharge disposition: 01-Home or Self Care   Discharge patient date: 06/10/2021   Increase activity slowly   Complete by: As directed  Allergies as of 06/10/2021   No Known Allergies      Medication List     STOP taking these medications    ascorbic acid 500 MG tablet Commonly known as: VITAMIN C       TAKE these medications    acetaminophen 500 MG tablet Commonly known as: TYLENOL Take 2 tablets (1,000 mg total) by mouth every 8 (eight) hours for 5 days.   amphetamine-dextroamphetamine 15 MG 24 hr capsule Commonly known as: ADDERALL XR Take 15 mg by mouth in the morning.   docusate sodium 100 MG capsule Commonly known as: COLACE Take 100 mg by mouth every other day. In the  morning   ferrous sulfate 325 (65 FE) MG tablet Take 325 mg by mouth every other day. In the morning   gabapentin 100 MG capsule Commonly known as: Neurontin Take 1 capsule (100 mg total) by mouth 3 (three) times daily for 5 days.   ibuprofen 600 MG tablet Commonly known as: ADVIL Take 1 tablet (600 mg total) by mouth every 8 (eight) hours as needed for up to 5 days for mild pain.   levothyroxine 50 MCG tablet Commonly known as: SYNTHROID Take 1 tablet (50 mcg total) by mouth daily before breakfast.   methocarbamol 500 MG tablet Commonly known as: ROBAXIN Take 1 tablet (500 mg total) by mouth every 6 (six) hours as needed for muscle spasms.   oxyCODONE 5 MG immediate release tablet Commonly known as: Oxy IR/ROXICODONE Take 1 tablet (5 mg total) by mouth every 6 (six) hours as needed for severe pain.   polyethylene glycol 17 g packet Commonly known as: MIRALAX / GLYCOLAX Take 17 g by mouth daily as needed for mild constipation.   simethicone 80 MG chewable tablet Commonly known as: MYLICON Chew 1 tablet (80 mg total) by mouth as needed for flatulence.        Follow-up Information     [name], MD. Schedule an appointment as soon as possible for a visit in 4 week(s).   Specialty: General Surgery Why: For wound re-check Contact information: [address] [place] [address] [phone]                  The results of significant diagnostics from this hospitalization (including imaging, microbiology, ancillary and laboratory) are listed below for reference.    Significant Diagnostic Studies: CT Abdomen Pelvis W Contrast  Result Date: 05/15/2021 CLINICAL DATA:  Abdominal pain, acute, nonlocalized. EXAM: CT ABDOMEN AND PELVIS WITH CONTRAST TECHNIQUE: Multidetector CT imaging of the abdomen and pelvis was performed using the standard protocol following bolus administration of intravenous contrast. CONTRAST:  OMNIPAQUE IOHEXOL 300 MG/ML   SOLN COMPARISON:  None. FINDINGS: Lower chest: Visualized lung bases are clear. The visualized heart and pericardium are unremarkable. Hepatobiliary: No focal liver abnormality is seen. No gallstones, gallbladder wall thickening, or biliary dilatation. Pancreas: Unremarkable Spleen: Unremarkable Adrenals/Urinary Tract: Adrenal glands are unremarkable. Kidneys are normal, without renal calculi, focal lesion, or hydronephrosis. Bladder is unremarkable. Stomach/Bowel: Stomach is within normal limits. Appendix appears normal. No evidence of bowel wall thickening, distention, or inflammatory changes. No free air. Vascular/Lymphatic: No significant vascular findings are present. No enlarged abdominal or pelvic lymph nodes. Reproductive: Uterus and bilateral adnexa are unremarkable. Other: A ventral hernia immediately superior to the umbilicus is present containing omental fat which demonstrates soft tissue infiltration and a small amount of fluid suggesting possible incarceration. The hernia defect measures 16 x 18 mm. The  hernia sac itself measures roughly 3.3 x 3.6 cm. There is a superimposed small fat containing umbilical hernia. Musculoskeletal: No acute bone abnormality. No lytic or blastic bone lesion. IMPRESSION: Combination umbilical and supraumbilical hernia. There is herniated omental fat within the supraumbilical hernia sac which demonstrates soft tissue infiltration and fluid possibly related to incarceration. Correlation with clinical examination is recommended. No additional acute intra-abdominal pathology identified. Electronically Signed   By: Helyn Numbers M.D.   On: 05/15/2021 01:58   US Abdomen Limited RUQ (LIVER/GB)  Result Date: 05/15/2021 CLINICAL DATA:  Right upper quadrant pain. EXAM: ULTRASOUND ABDOMEN LIMITED RIGHT UPPER QUADRANT COMPARISON:  None. FINDINGS: Gallbladder: A small, ill-defined echogenic area is seen along the gallbladder wall in the region of the gallbladder fundus. There is  no evidence of gallbladder wall thickening (2.4 mm). No sonographic Murphy sign noted by sonographer. Common bile duct: Diameter: 3.6 mm Liver: No focal lesion identified. Within normal limits in parenchymal echogenicity. Portal vein is patent on color Doppler imaging with normal direction of blood flow towards the liver. Other: None. IMPRESSION: 1. Echogenic area within the region of the gallbladder fundus which may represent tiny calcifications versus slice thickness artifact from adjacent bowel gas. 2. Otherwise, unremarkable right upper quadrant ultrasound. Electronically Signed   By: Aram Candela M.D.   On: 05/15/2021 00:57    Microbiology: No results found for this or any previous visit (from the past 240 hour(s)).   Labs: Basic Metabolic Panel: Recent Labs  Lab 06/10/21 0321  NA 139  K 3.9  CL 106  CO2 27  GLUCOSE 90  BUN 12  CREATININE 0.87  CALCIUM 9.0   Liver Function Tests: No results for input(s): AST, ALT, ALKPHOS, BILITOT, PROT, ALBUMIN in the last 168 hours. No results for input(s): LIPASE, AMYLASE in the last 168 hours. No results for input(s): AMMONIA in the last 168 hours. CBC: Recent Labs  Lab 06/09/21 0308 06/10/21 0321  WBC 9.6 6.8  HGB 10.3* 9.8*  HCT 33.5* 32.6*  MCV 90.1 92.6  PLT 314 312   Cardiac Enzymes: No results for input(s): CKTOTAL, CKMB, CKMBINDEX, TROPONINI in the last 168 hours. BNP: BNP (last 3 results) No results for input(s): BNP in the last 8760 hours.  ProBNP (last 3 results) No results for input(s): PROBNP in the last 8760 hours.  CBG: No results for input(s): GLUCAP in the last 168 hours.  Active Problems:   S/P hernia repair   S/P laparoscopic hernia repair   Time coordinating discharge: 20 min  Signed:  Atilano Ina, MD [facility], [place] [phone] 06/11/2021, 9:03 AM

## 2021-08-31 IMAGING — US US OB < 14 WEEKS - US OB TV
1 series · 15 of 28 positions shown · non-contrast
Comparison: None.

CLINICAL DATA: Bleeding

EXAM:
OBSTETRIC <14 WK US AND TRANSVAGINAL OB US
TECHNIQUE: Both transabdominal and transvaginal ultrasound examinations were
performed for complete evaluation of the gestation as well as the
maternal uterus, adnexal regions, and pelvic cul-de-sac.
Transvaginal technique was performed to assess early pregnancy.

[Series 1: us ob < 14 weeks - us ob tv · 15 of 44 slices shown]
[im 1/44]
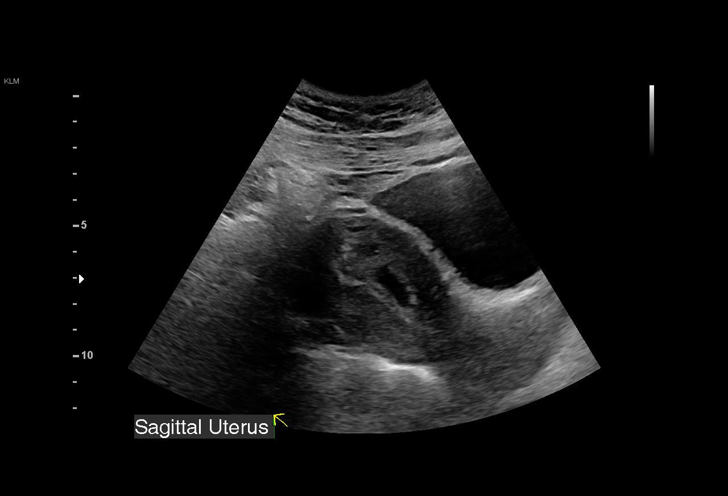
[im 4/44]
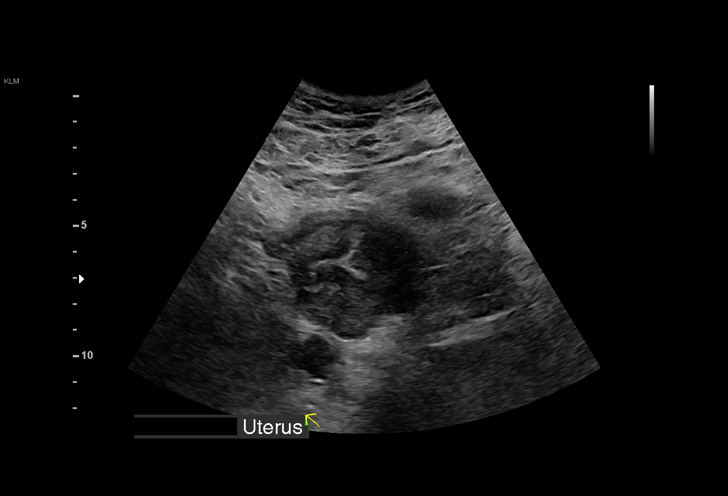
[im 7/44]
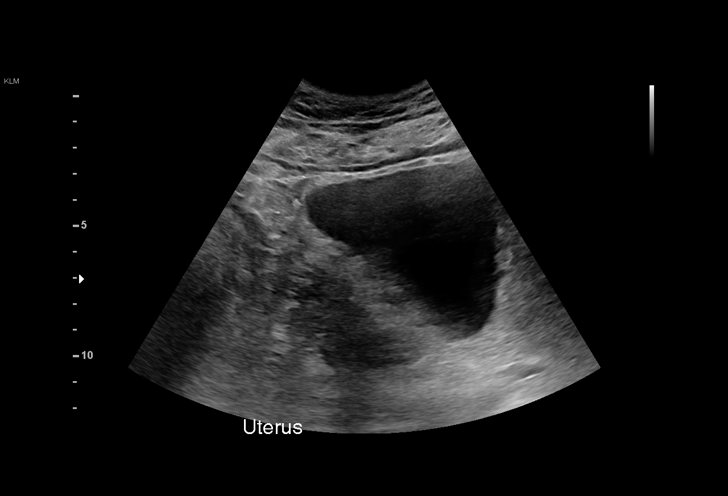
[im 10/44]
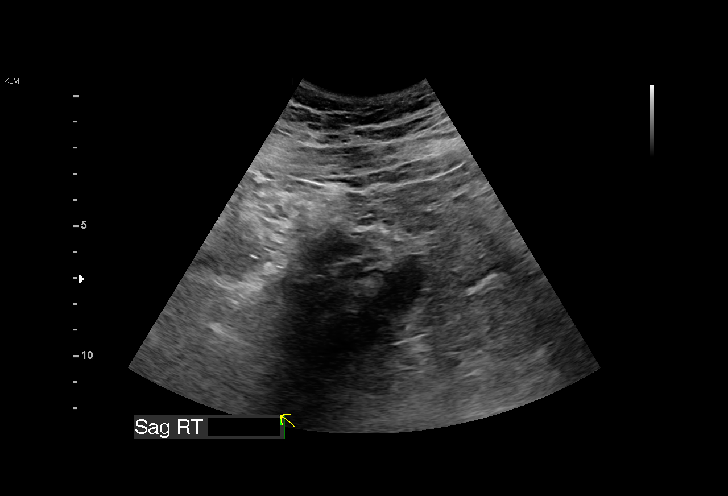
[im 13/44]
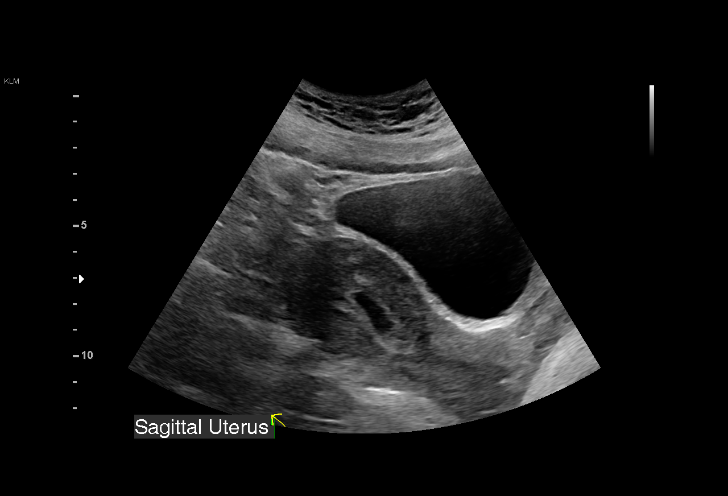
[im 16/44]
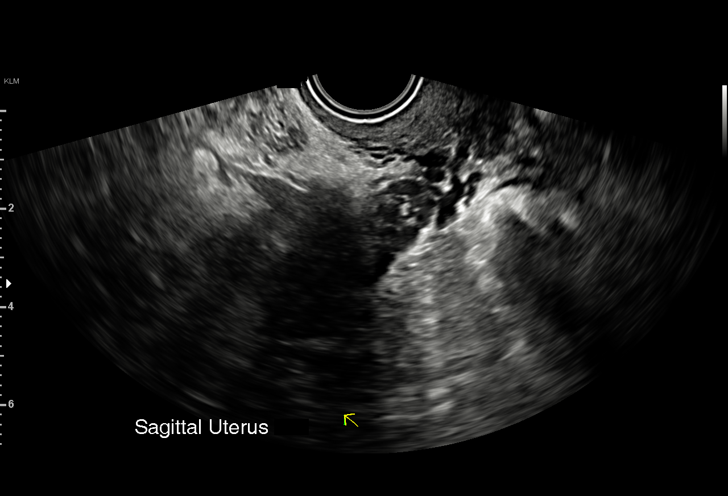
[im 20/44]
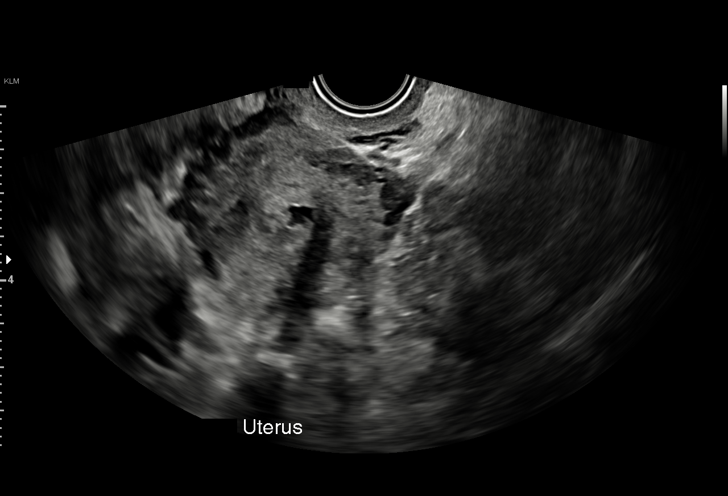
[im 23/44]
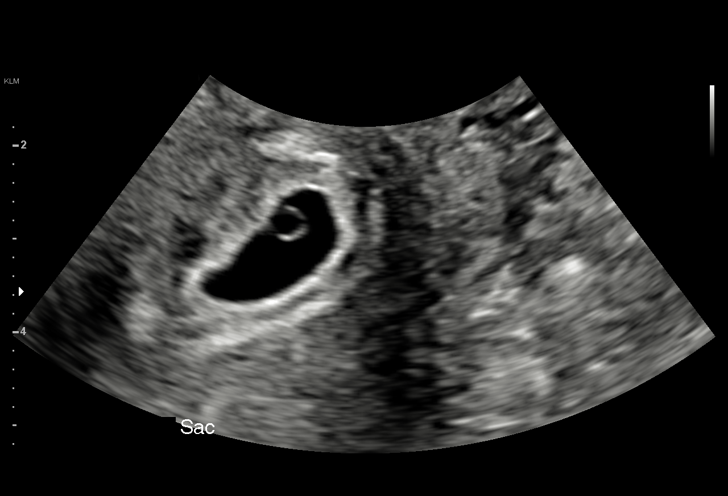
[im 24/44]
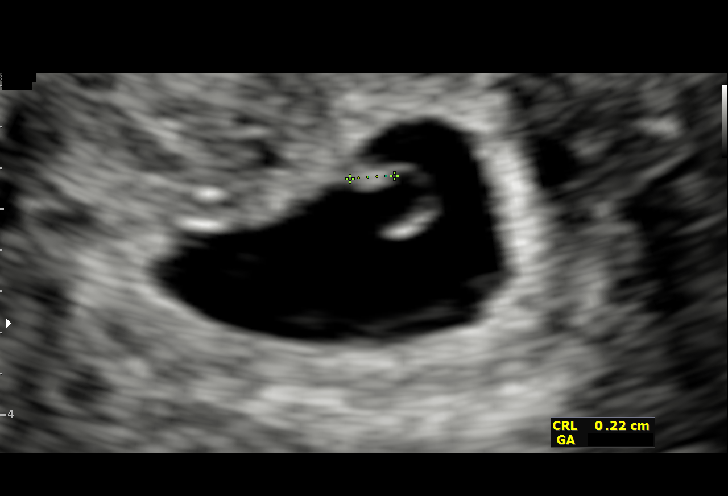
[im 28/44]
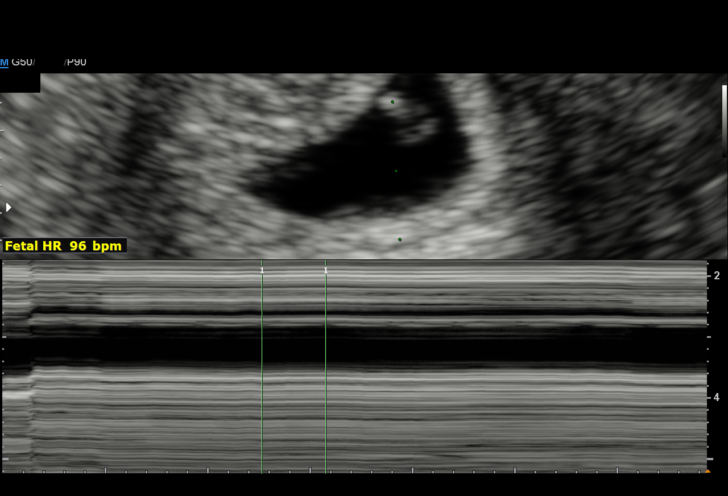
[im 31/44]
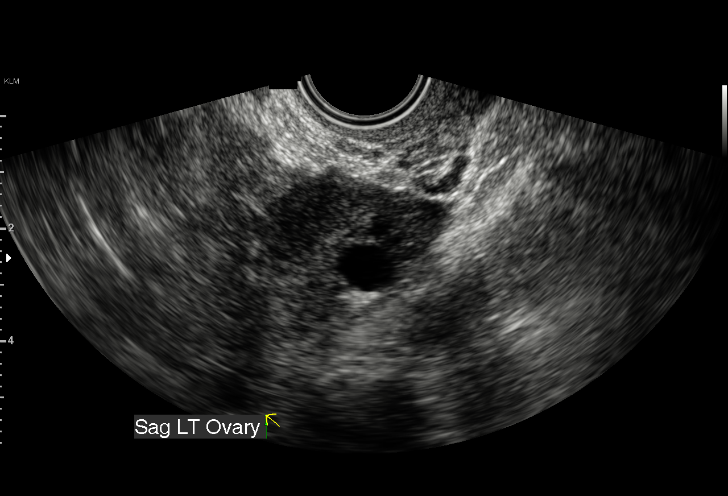
[im 34/44]
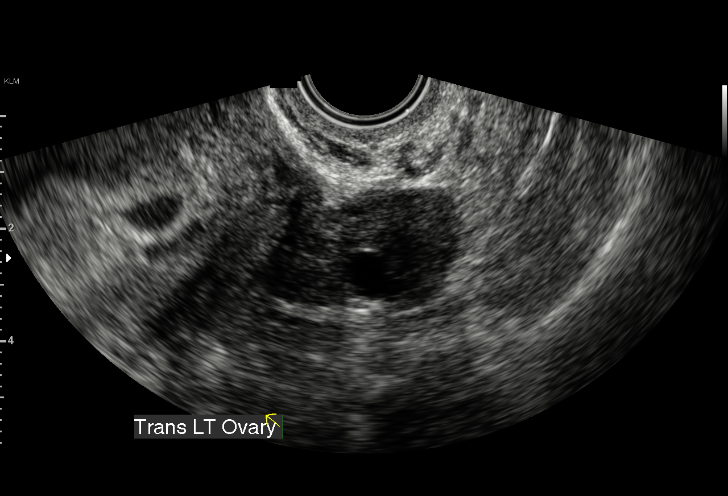
[im 37/44]
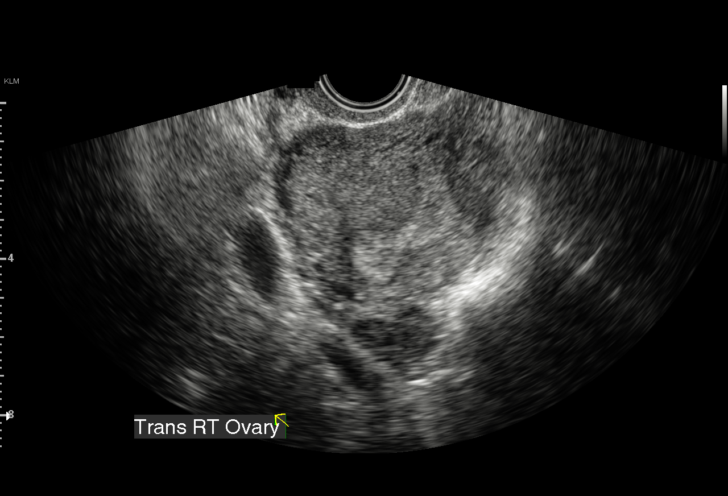
[im 40/44]
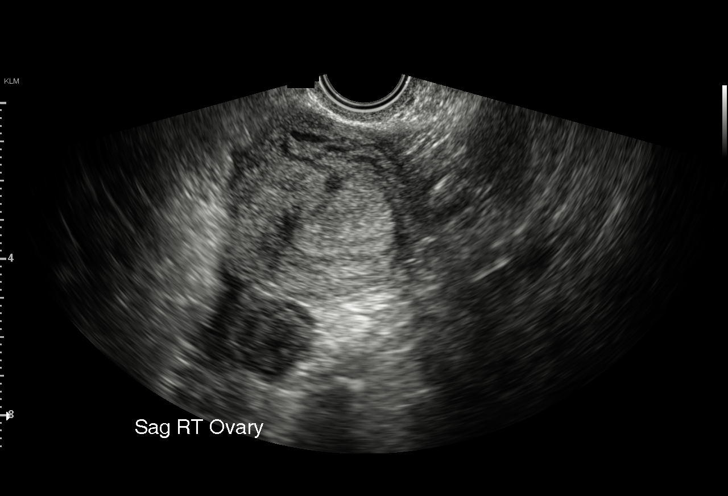
[im 44/44]
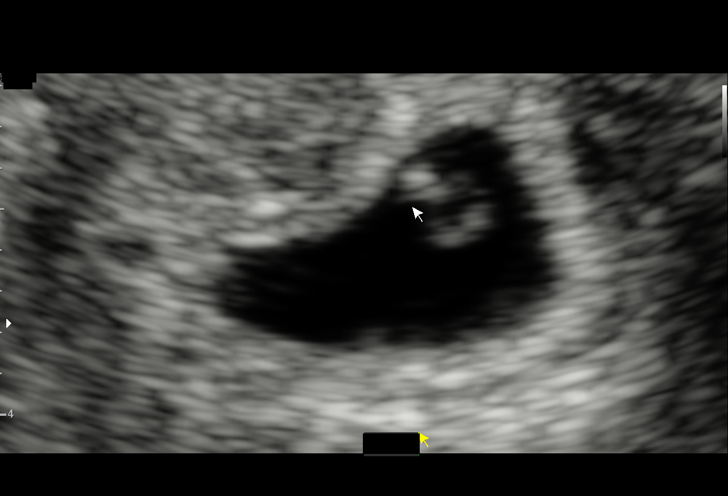

[15 of 28 positions shown; findings below may reference images not displayed]

FINDINGS: Intrauterine gestational sac: Single

Yolk sac:  Visualized.

Embryo:  Visualized.

Cardiac Activity: Visualized.

Heart Rate: 96 bpm

CRL:  2.3 mm   5 w   5 d                  US EDC: 12/17/2020

Subchorionic hemorrhage:  None visualized.

Maternal uterus/adnexae: None
IMPRESSION: Single live IUP at 5 weeks and 5 days. The fetal heart rate is
bradycardic.

## 2022-02-19 IMAGING — US US MFM OB DETAIL+14 WK
1 series · 13 of 28 positions shown · non-contrast
Comparison: none

[Series 1: us mfm ob detail+14 wk · 13 of 46 slices shown]
[im 2/46]
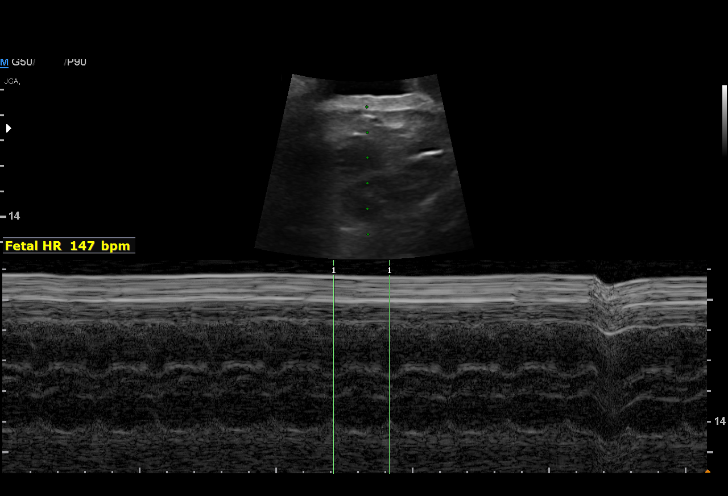
[im 6/46]
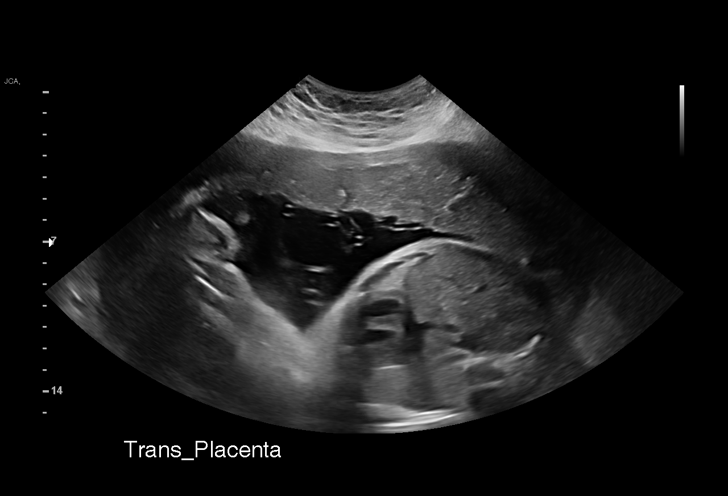
[im 9/46]
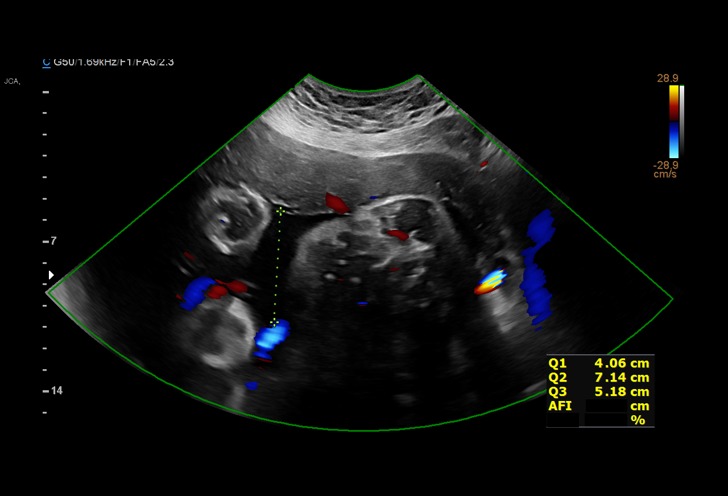
[im 12/46]
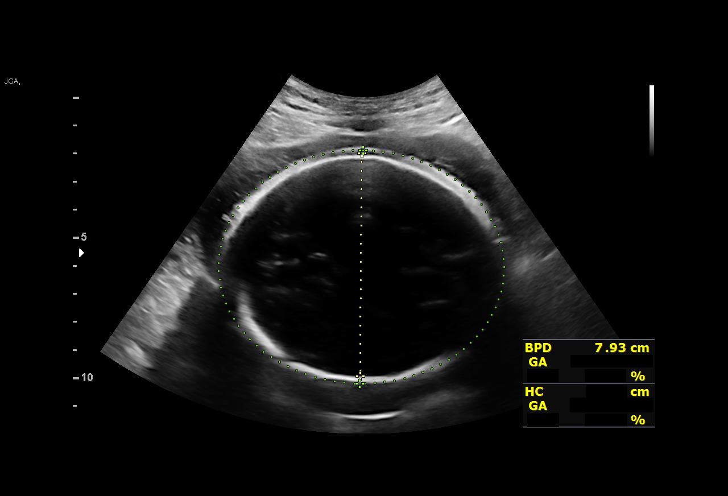
[im 16/46]
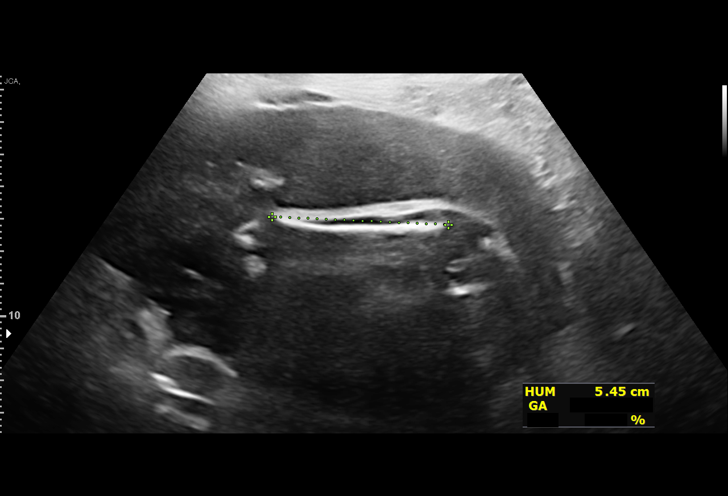
[im 19/46]
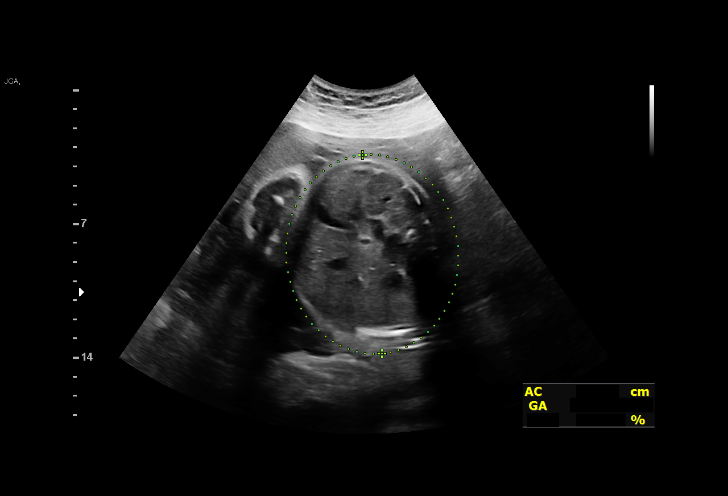
[im 24/46]
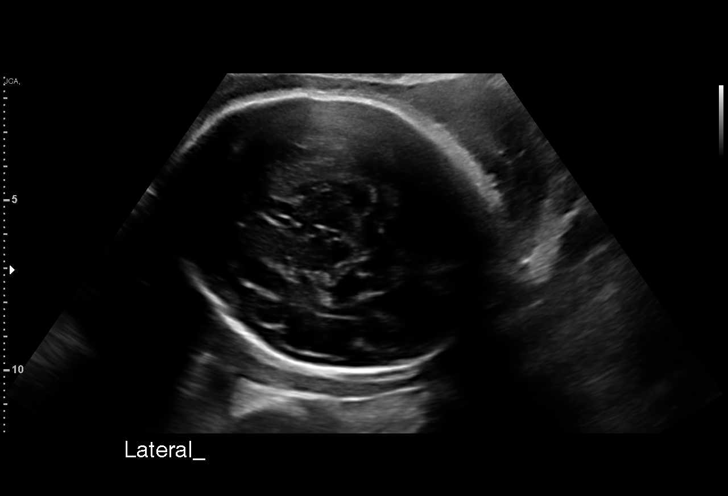
[im 27/46]
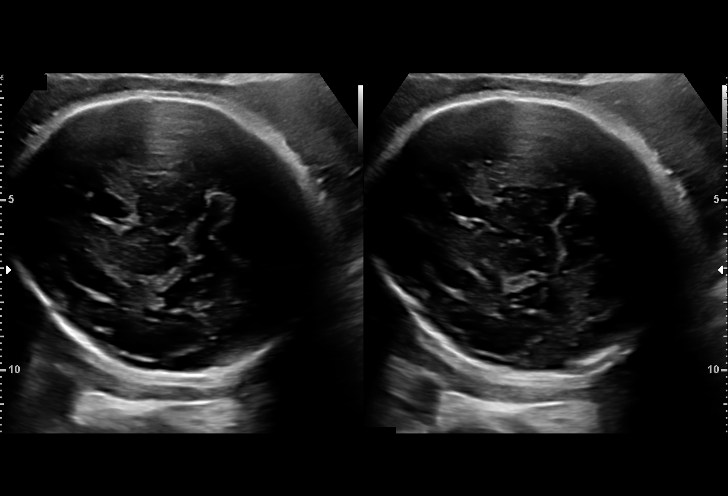
[im 31/46]
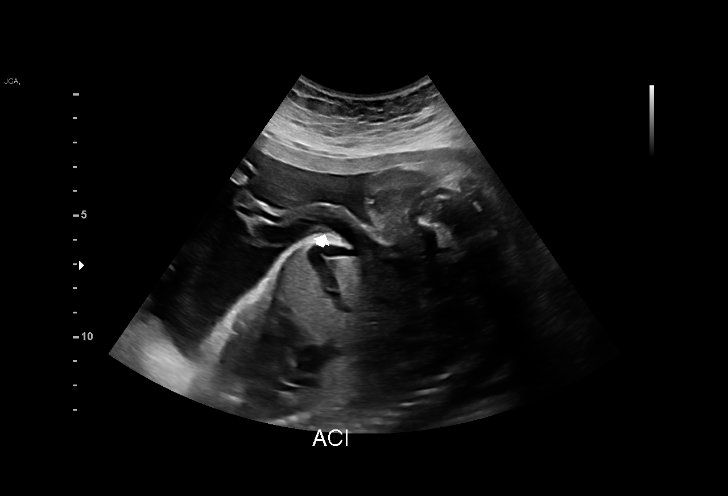
[im 34/46]
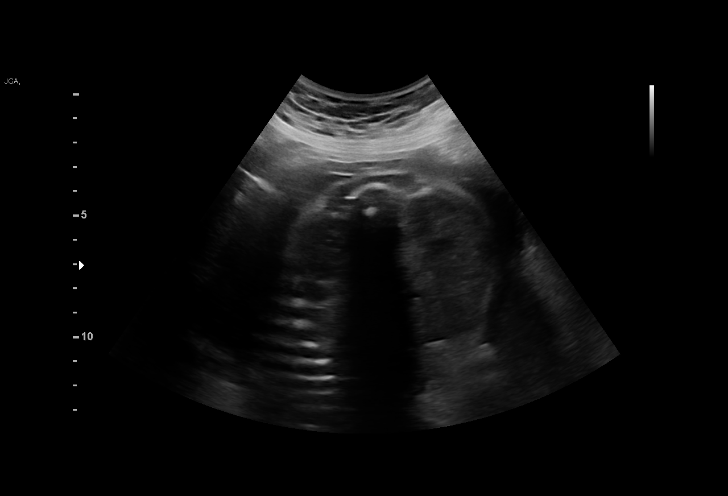
[im 37/46]
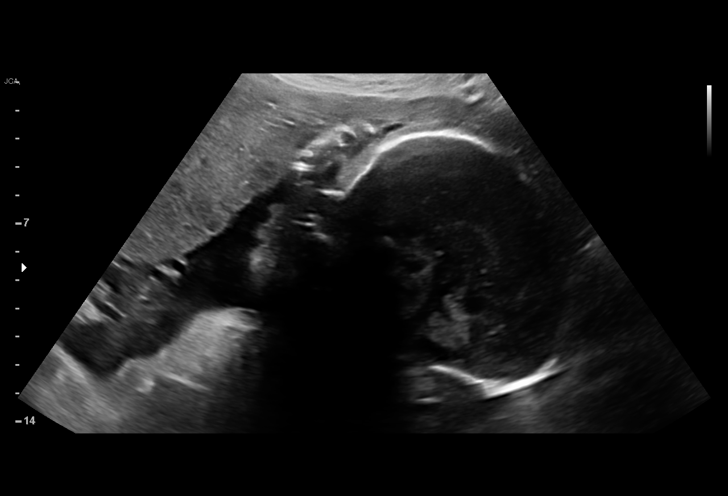
[im 41/46]
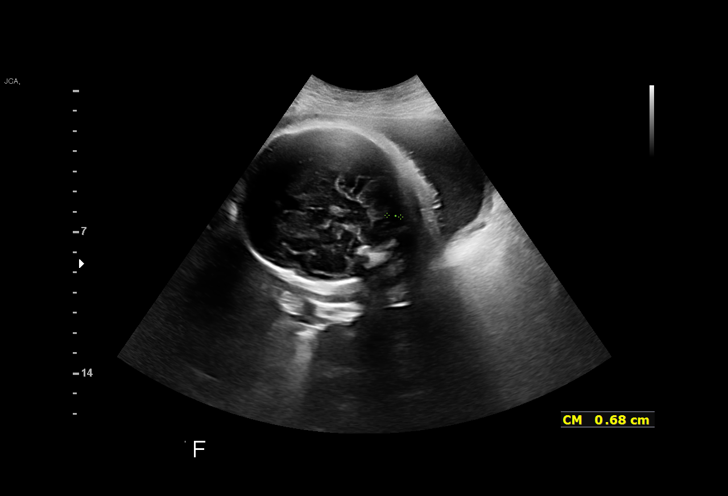
[im 44/46]
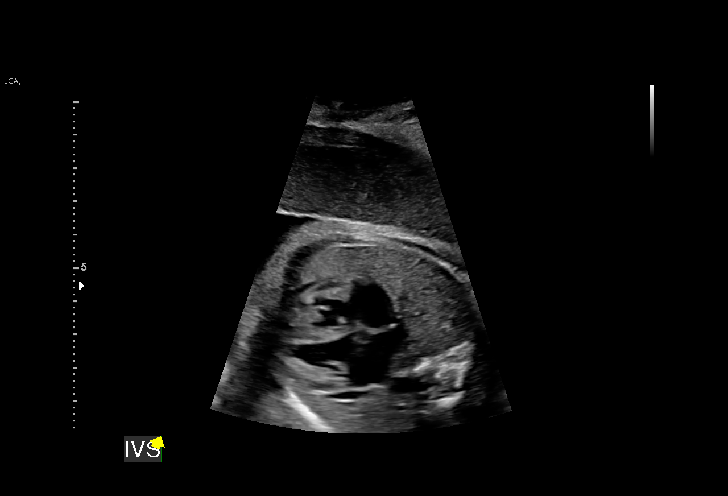

[13 of 28 positions shown; findings below may reference images not displayed]

Attending:        Haizhou Narwani        Secondary Phy.:   SEIICHIRO
                                                            CLAY CNM

Indications

 Hypothyroid (Synthyroid)
 Obesity complicating pregnancy, third
 trimester
 Gestational diabetes in pregnancy, diet
 controlled
 Encounter for antenatal screening for
 malformations
 30 weeks gestation of pregnancy
Fetal Evaluation

 Num Of Fetuses:         1
 Fetal Heart Rate(bpm):  147
 Cardiac Activity:       Observed
 Presentation:           Cephalic
 Placenta:               Anterior Fundal
 P. Cord Insertion:      Visualized

 Amniotic Fluid
 AFI FV:      Within normal limits

 AFI Sum(cm)     %Tile       Largest Pocket(cm)
 19.35           74

 RUQ(cm)       RLQ(cm)       LUQ(cm)        LLQ(cm)

Biometry

 BPD:      79.6  mm     G. Age:  32w 0d         85  %    CI:        74.24   %    70 - 86
                                                         FL/HC:      20.4   %    19.2 -
 HC:      293.3  mm     G. Age:  32w 3d         74  %    HC/AC:      0.94        0.99 -
 AC:      312.2  mm     G. Age:  35w 1d       > 99  %    FL/BPD:     75.3   %    71 - 87
 FL:       59.9  mm     G. Age:  31w 1d         61  %    FL/AC:      19.2   %    20 - 24
 HUM:      54.2  mm     G. Age:  31w 4d         75  %

 LV:        2.3  mm

 Est. FW:    9909  gm    4 lb 14 oz    > 99  %
OB History

 Gravidity:    3         Term:   1
 TOP:          1        Living:  1
Gestational Age

 U/S Today:     32w 5d                                        EDD:   11/30/20
 Best:          30w 2d     Det. By:  Previous Ultrasound      EDD:   12/17/20
                                     (04/21/20)
Anatomy

 Cranium:               Appears normal         LVOT:                   Not well visualized
 Cavum:                 Appears normal         Aortic Arch:            Not well visualized
 Ventricles:            Appears normal         Ductal Arch:            Not well visualized
 Choroid Plexus:        Appears normal         Diaphragm:              Appears normal
 Cerebellum:            Appears normal         Stomach:                Appears normal, left
                                                                       sided
 Posterior Fossa:       Appears normal         Abdomen:                Appears normal
 Nuchal Fold:           Not applicable (>20    Abdominal Wall:         Appears nml (cord
                        wks GA)                                        insert, abd wall)
 Face:                  Not well visualized    Cord Vessels:           Not well visualized
 Lips:                  Appears normal         Kidneys:                Appear normal
 Palate:                Appears normal         Bladder:                Appears normal
 Thoracic:              Appears normal         Spine:                  Not well visualized
 Heart:                 Not well visualized    Upper Extremities:      Not well visualized
 RVOT:                  Not well visualized    Lower Extremities:      Not well visualized

 Other:  Technically difficult due to maternal habitus and fetal position.
         Technicallly difficult due to advanced GA and maternal habitus.
Impression

 G3 P1.  Patient is here for ultrasound evaluation.  She had
 fetal anatomical survey performed elsewhere and report is
 that it was normal.
 On cell free fetal DNA screening, she had low risk for fetal
 aneuploidies.
 She has a recent diagnosis of gestational diabetes that is
 reportedly well controlled on diet.
 On ultrasound, the estimated fetal weight is at the 99th
 percentile.  Amniotic fluid is normal and good fetal activity
 seen.  Fetal anatomical survey appears normal but very
 limited by advanced gestational age.
 Maternal obesity imposed limitations on the resolution of
 images.
Recommendations

 -An appointment was made for her to return in 4 weeks for
 fetal growth assessment.
 --If patient requires insulin or oral hypoglycemics, we
 recommend weekly antenatal testing till delivery.

                 Bansal [place], [place]

## 2022-03-24 IMAGING — US US MFM OB FOLLOW-UP
2 series · 13 of 28 positions shown · non-contrast
Comparison: none

[Series 1: us mfm ob follow-up · 12 of 68 slices shown (1 of 2)]
[im 3/68]
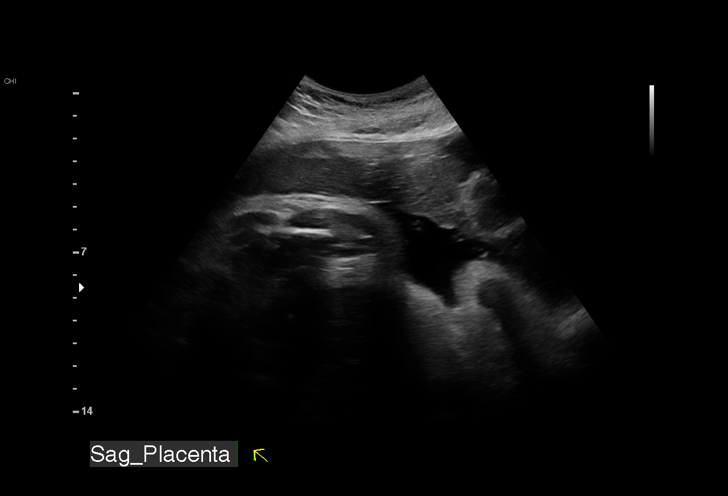
[im 9/68]
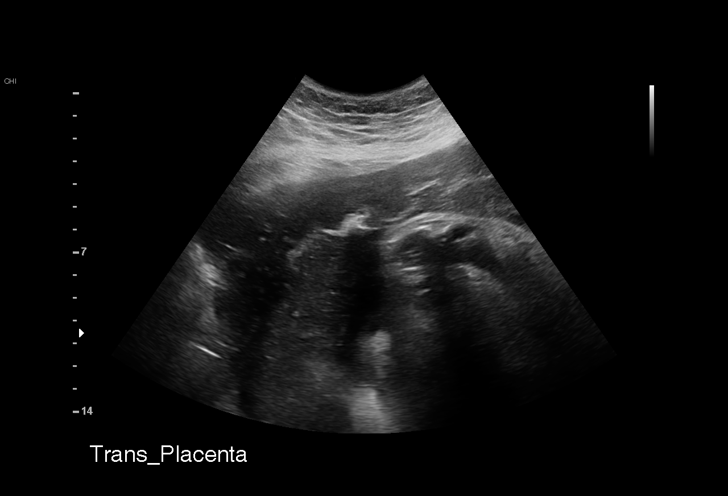
[im 14/68]
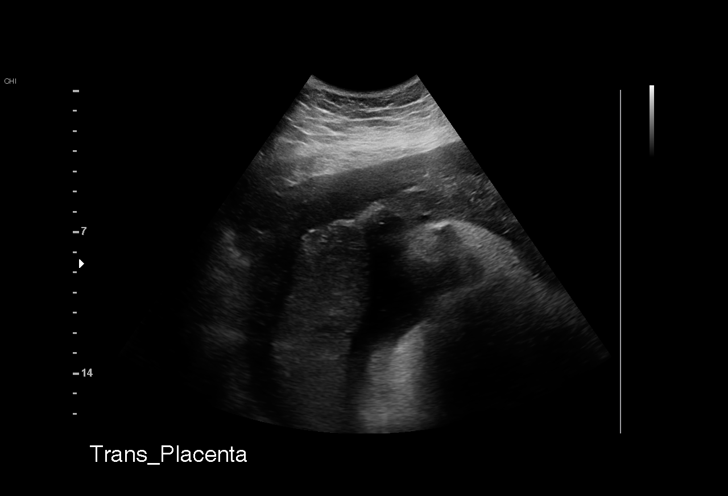
[im 19/68]
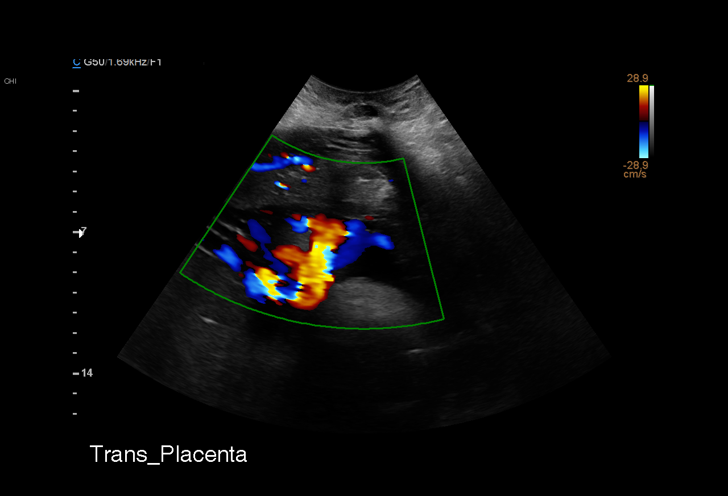
[im 25/68]
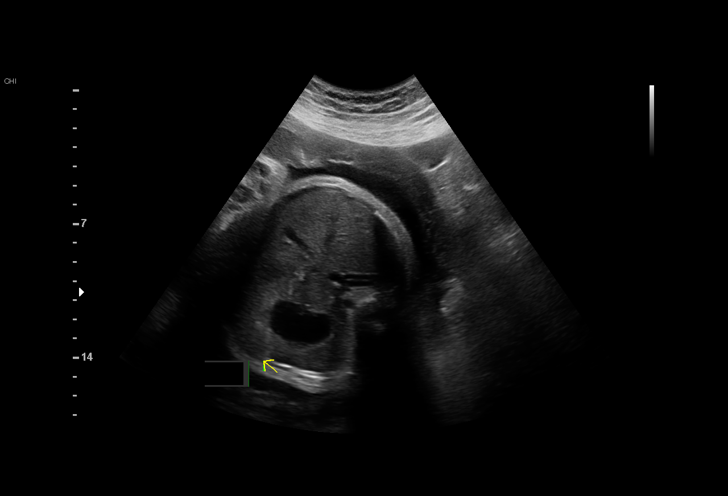
[im 30/68]
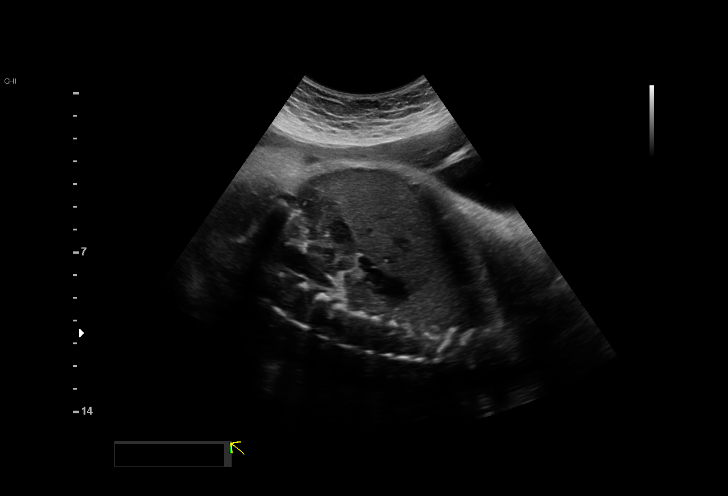
[im 38/68]
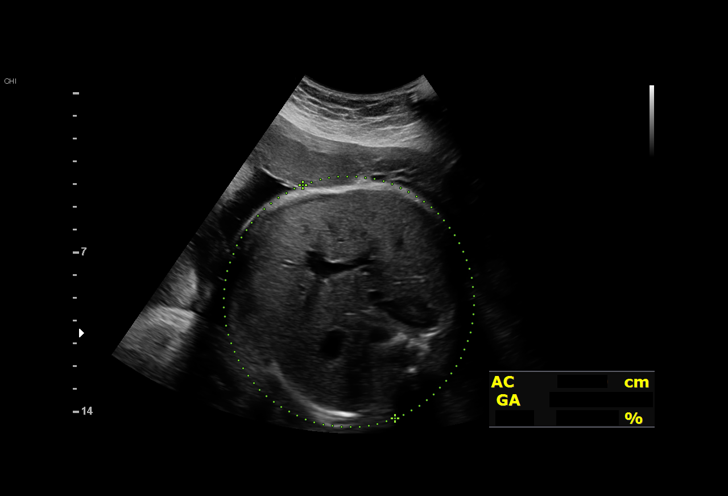
[im 43/68]
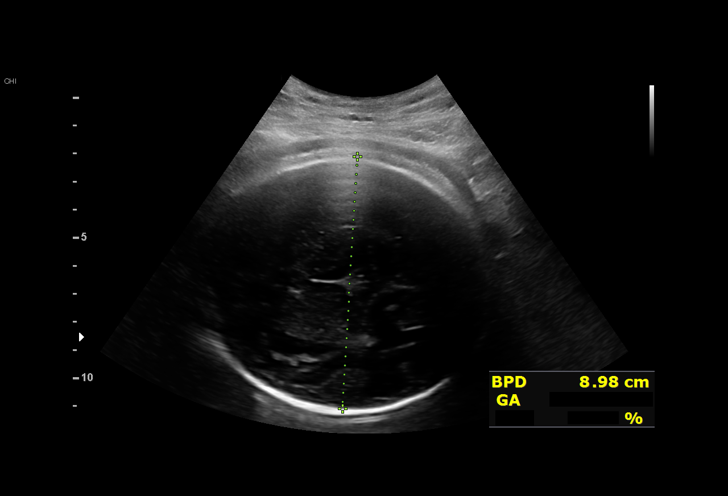
[im 49/68]
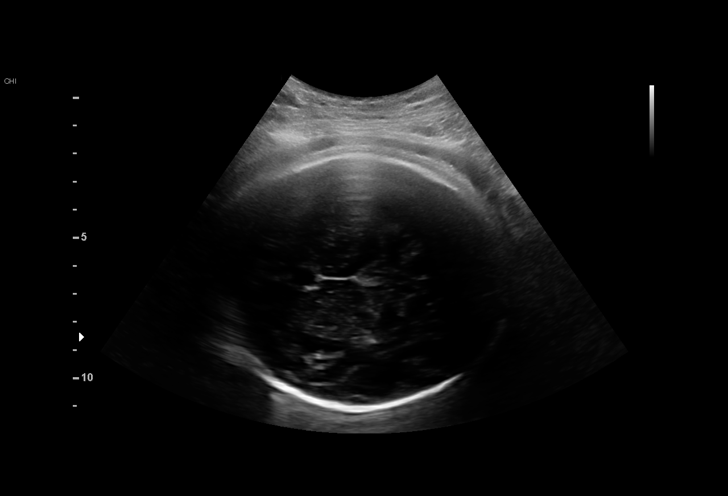
[im 54/68]
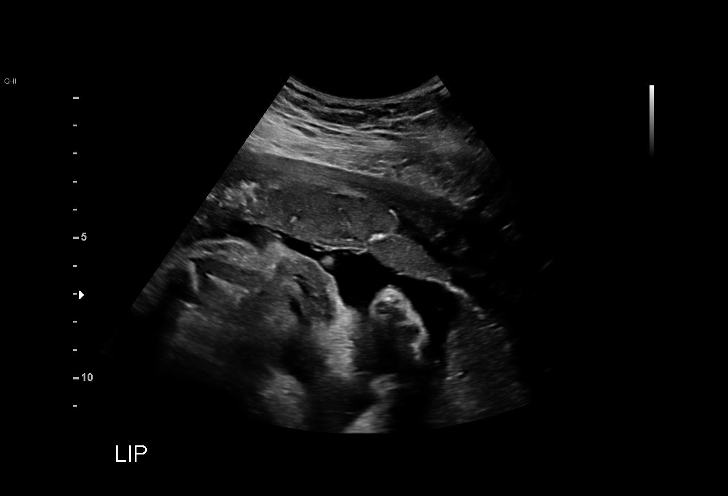
[im 59/68]
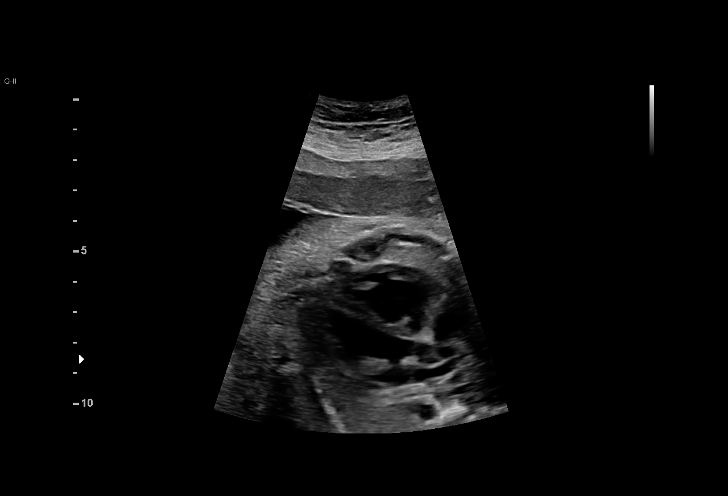
[im 65/68]
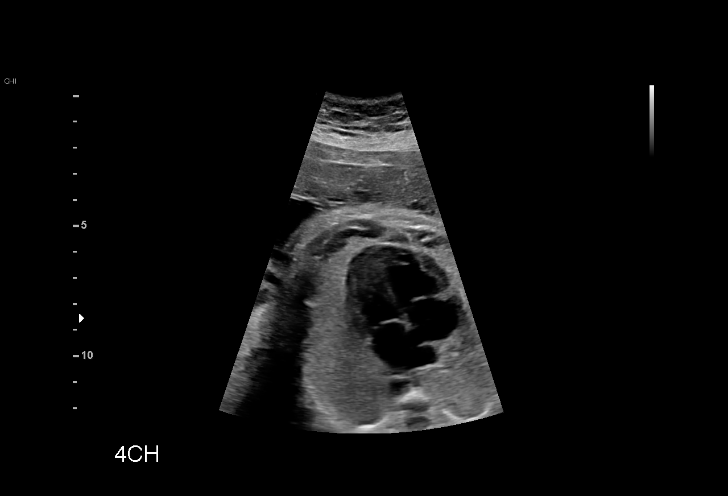

[Series 3: us mfm ob follow-up · 1 of 4 slices shown (2 of 2)]
[im 1/4]
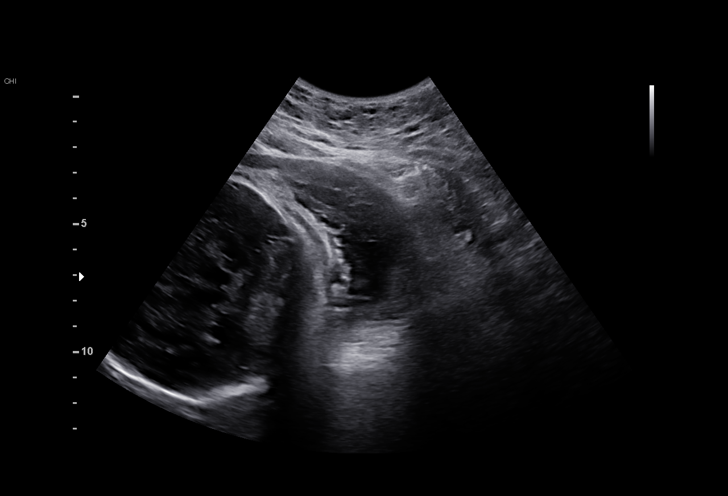

[13 of 28 positions shown; findings below may reference images not displayed]

KHUTRI CNM

Indications

 Gestational diabetes in pregnancy, diet
 controlled
 35 weeks gestation of pregnancy
 Hypothyroid (Synthyroid)
 Obesity complicating pregnancy, third
 trimester
 Antenatal follow-up for nonvisualized fetal
 anatomy
Fetal Evaluation

 Num Of Fetuses:         1
 Fetal Heart Rate(bpm):  155
 Cardiac Activity:       Observed
 Presentation:           Cephalic
 Placenta:               Anterior Fundal
 P. Cord Insertion:      Previously Visualized

 Amniotic Fluid
 AFI FV:      Within normal limits

 AFI Sum(cm)     %Tile       Largest Pocket(cm)
 21.76           82

 RUQ(cm)       RLQ(cm)       LUQ(cm)        LLQ(cm)

Biometry

 BPD:      89.9  mm     G. Age:  36w 3d         87  %    CI:        78.43   %    70 - 86
                                                         FL/HC:      21.0   %    20.1 -
 HC:      321.1  mm     G. Age:  36w 2d         47  %    HC/AC:      0.93        0.93 -
 AC:      343.5  mm     G. Age:  38w 2d       > 99  %    FL/BPD:     75.1   %    71 - 87
 FL:       67.5  mm     G. Age:  34w 5d         34  %    FL/AC:      19.7   %    20 - 24

 Est. FW:    8039  gm    6 lb 13 oz      93  %
OB History

 Gravidity:    3         Term:   1
 TOP:          1        Living:  1
Gestational Age

 U/S Today:     36w 3d                                        EDD:   12/07/20
 Best:          35w 0d     Det. By:  Previous Ultrasound      EDD:   12/17/20
                                     (04/21/20)
Anatomy

 Cranium:               Appears normal         Aortic Arch:            Not well visualized
 Cavum:                 Appears normal         Ductal Arch:            Not well visualized
 Ventricles:            Appears normal         Diaphragm:              Appears normal
 Choroid Plexus:        Previously seen        Stomach:                Appears normal, left
                                                                       sided
 Cerebellum:            Previously seen        Abdomen:                Appears normal
 Posterior Fossa:       Previously seen        Abdominal Wall:         Appears nml (cord
                                                                       insert, abd wall)
 Nuchal Fold:           Not applicable (>20    Cord Vessels:           Not well visualized
                        wks GA)
 Face:                  Orbits appear          Kidneys:                Appear normal
                        normal
 Lips:                  Previously seen        Bladder:                Appears normal
 Thoracic:              Appears normal         Spine:                  Not well visualized
 Heart:                 Appears normal         Upper Extremities:      Not well visualized
                        (4CH, axis, and
                        situs)
 RVOT:                  Not well visualized    Lower Extremities:      Not well visualized
 LVOT:                  Appears normal

 Other:  Technicallly difficult due to advanced GA and maternal habitus.
Cervix Uterus Adnexa

 Cervix
 Not visualized (advanced GA >74wks)
Comments

 This patient was seen for a follow up growth scan due to
 maternal obesity with a BMI of over 40 and diet-controlled
 gestational diabetes.  She denies any problems since her last
 exam.
 She was informed that the fetal growth measures large for
 her gestational [AGE][REDACTED] percentile).  There was normal
 amniotic fluid noted today.
 As her BMI is over 40, we will start weekly biophysical profiles
 next week and continue these tests until delivery.
 A biophysical profile was scheduled in 1 week.

## 2022-03-31 IMAGING — US US FETAL BPP W/ NON-STRESS
1 series · 13 of 14 positions shown · non-contrast
Comparison: none

[Series 1: us fetal bpp w/ non-stress · 14 acquisitions, 13 frames shown]
[im 1/14]
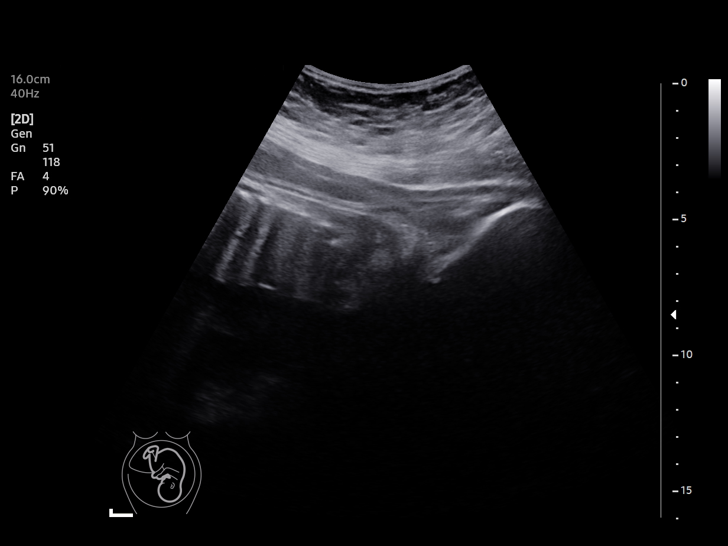
[im 2/14]
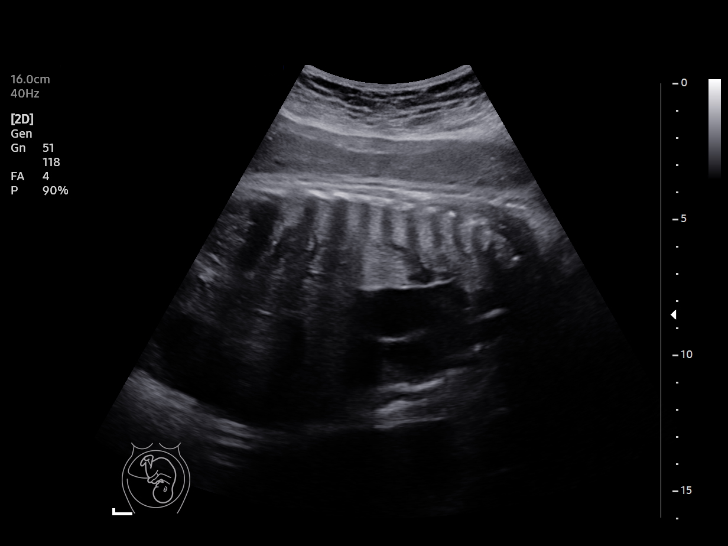
[im 3/14]
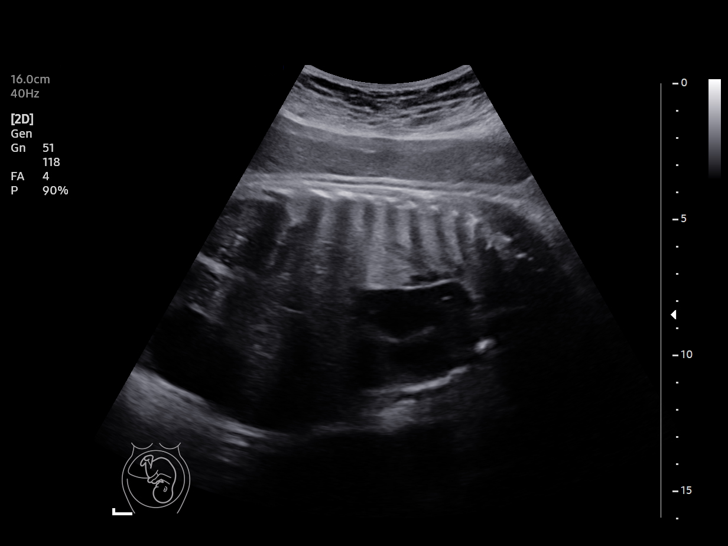
[im 4/14]
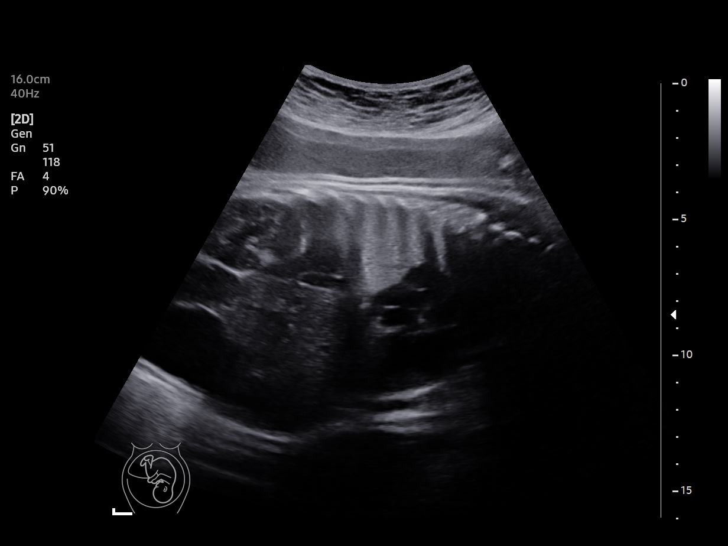
[im 5/14]
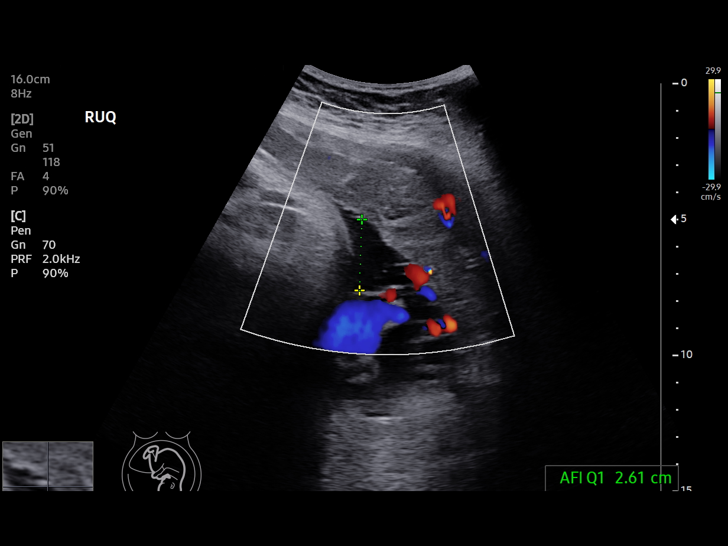
[im 6/14]
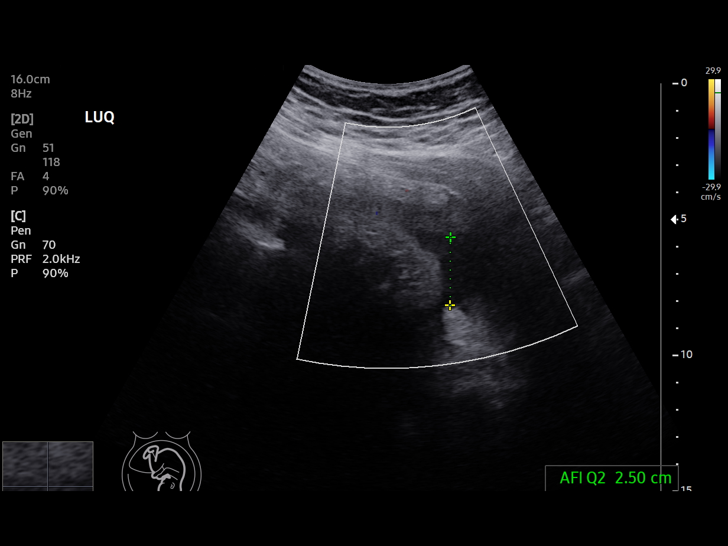
[im 8/14]
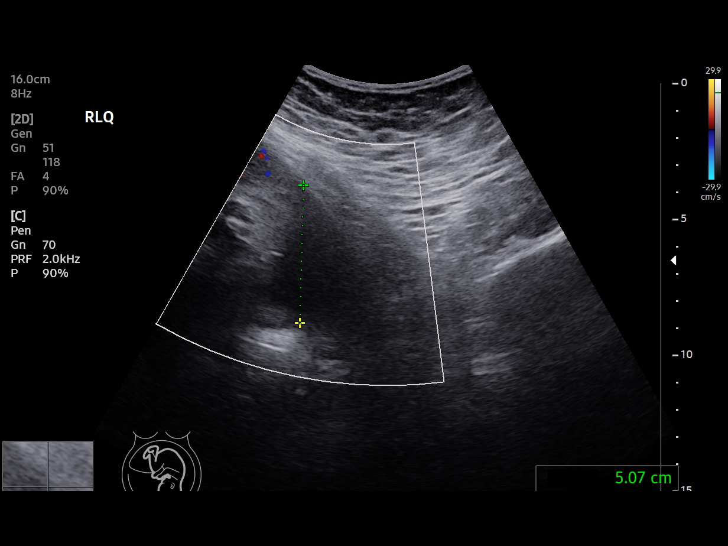
[im 9/14]
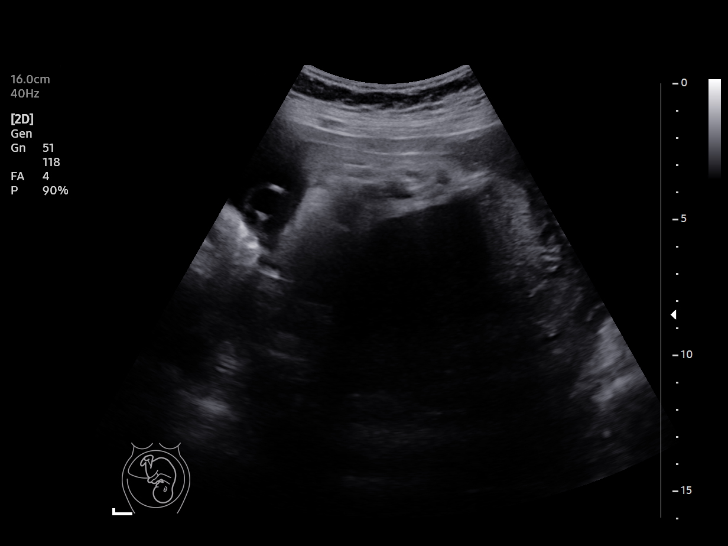
[im 10/14]
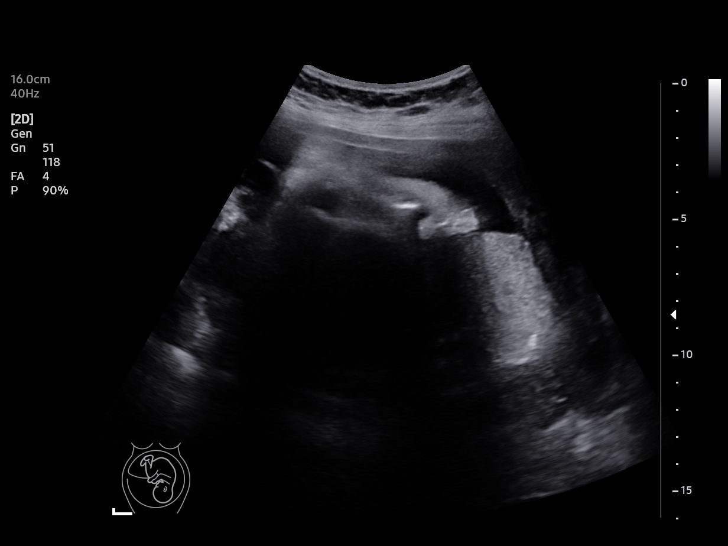
[im 11/14]
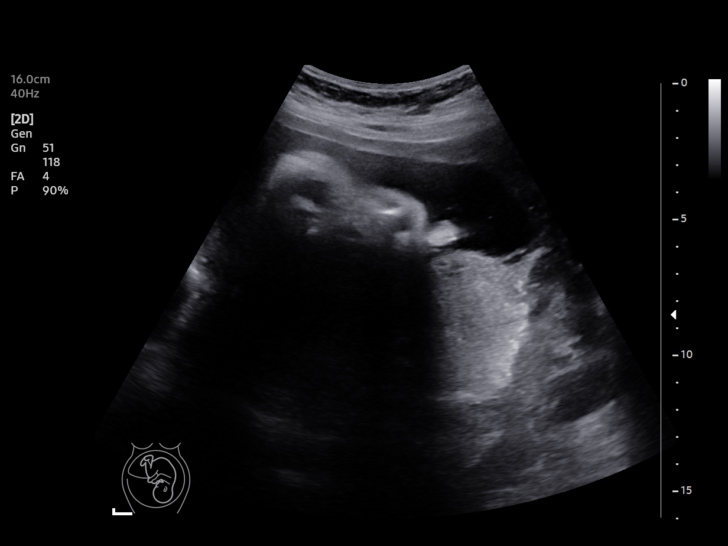
[im 12/14]
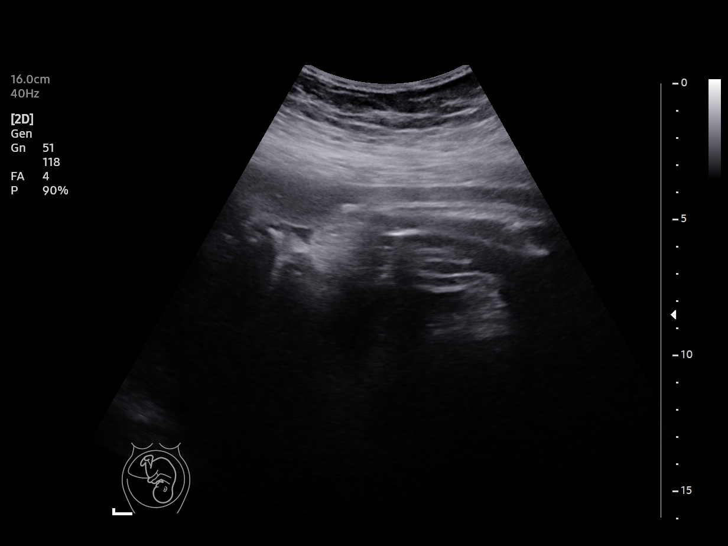
[im 13/14]
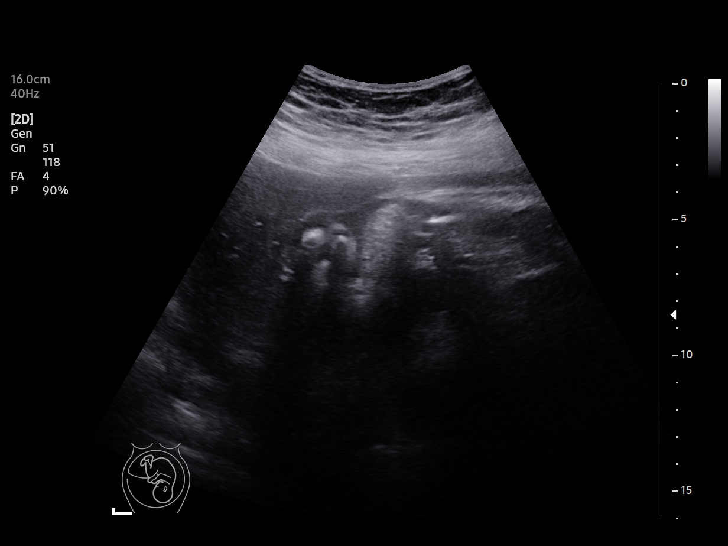
[im 14/14]
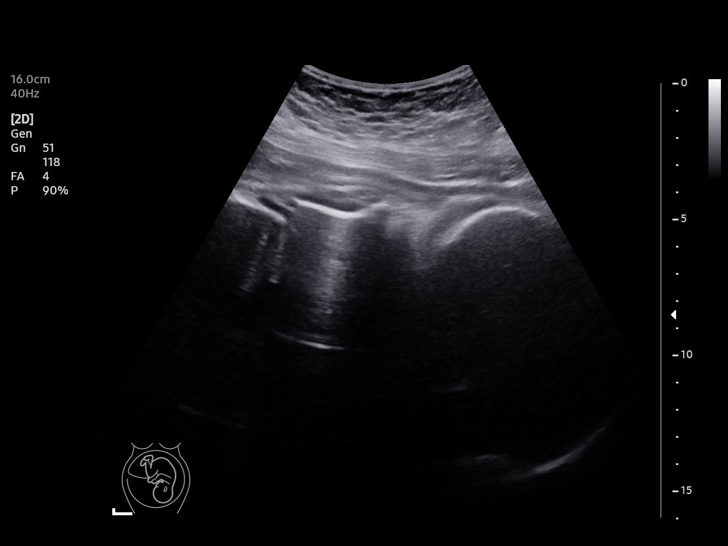

[13 of 14 positions shown; findings below may reference images not displayed]

Attending:        Umair Gabriel       Secondary Phy.:    KERRON
                                                             TERESSA CNM
                                                             [REDACTED]care at

Service(s) Provided

Indications

 36 weeks gestation of pregnancy
 Obesity complicating pregnancy, third
 trimester
 Gestational diabetes in pregnancy, diet
 controlled
Fetal Evaluation

 Num Of Fetuses:          1
 Preg. Location:          Intrauterine
 Cardiac Activity:        Observed
 Presentation:            Cephalic

 Amniotic Fluid
 AFI FV:      Within normal limits

 AFI Sum(cm)     %Tile       Largest Pocket(cm)
 14.6            53

 RUQ(cm)       RLQ(cm)       LUQ(cm)        LLQ(cm)


 Comment:    [DATE] BPP, NST category 1 and reactive
Biophysical Evaluation

 Amniotic F.V:   Pocket => 2 cm             F. Tone:         Observed
 F. Movement:    Observed                   N.S.T:           Reactive
 F. Breathing:   Observed                   Score:           [DATE]
OB History

 Gravidity:    3         Term:   1
 TOP:          1         Living: 1
Gestational Age

 Best:          36w 0d     Det. By:  Previous Ultrasound      EDD:   12/17/20
                                     (04/21/20)

## 2022-04-27 ENCOUNTER — Encounter: Payer: Self-pay | Admitting: Physician Assistant

## 2022-04-27 ENCOUNTER — Ambulatory Visit (INDEPENDENT_AMBULATORY_CARE_PROVIDER_SITE_OTHER): Payer: Commercial Managed Care - PPO | Admitting: Physician Assistant

## 2022-04-27 VITALS — BP 123/83 | HR 74 | Temp 98.0°F | Ht 66.0 in | Wt 272.5 lb

## 2022-04-27 DIAGNOSIS — E039 Hypothyroidism, unspecified: Secondary | ICD-10-CM

## 2022-04-27 DIAGNOSIS — F908 Attention-deficit hyperactivity disorder, other type: Secondary | ICD-10-CM | POA: Diagnosis not present

## 2022-04-27 DIAGNOSIS — Z23 Encounter for immunization: Secondary | ICD-10-CM

## 2022-04-27 MED ORDER — LEVOTHYROXINE SODIUM 50 MCG PO TABS: 50.0000 ug | ORAL_TABLET | Freq: Every day | ORAL | 1 refills | Status: AC

## 2022-04-27 MED ORDER — AMPHETAMINE-DEXTROAMPHET ER 15 MG PO CP24: 15.0000 mg | ORAL_CAPSULE | Freq: Two times a day (BID) | ORAL | 0 refills | Status: DC

## 2022-04-27 NOTE — Progress Notes (Signed)
Subjective:    Patient ID: [name], female    DOB: 1985-12-14, 36 y.o.   MRN: [id number]  Chief Complaint  Patient presents with   Establish Care   ADHD    Medication refill   Hypothyroidism    HPI 36 y.o. patient presents today for new patient establishment with me.  Patient was previously established with [name]. L & D nurse with [facility]. 35 yo son and 35 month old daughter.   Current Care Team: None   Acute Concerns: Needing medications refilled  Chronic Concerns: See PMH listed below, as well as A/P for details on issues we specifically discussed during today's visit.      Past Medical History:  Diagnosis Date   ADD (attention deficit disorder)    Allergy    RHINITIS   Anemia    BV (bacterial vaginosis)    Eczema    Gestational diabetes    Headache(784.0)    intermittent   Hypothyroidism    Obesity    UTI (lower urinary tract infection)    not frequently    Past Surgical History:  Procedure Laterality Date   VENTRAL HERNIA REPAIR N/A 06/08/2021   Procedure: LAPAROSCOPIC ASSISTED REPAIR OF INCARCERATED VENTRAL HERNIA WITH MESH;  Surgeon: Greer Pickerel, MD;  Location: [facility];  Service: General;  Laterality: N/A;   WISDOM TOOTH EXTRACTION      Family History  Problem Relation Age of Onset   Fibromyalgia Mother    Lupus Mother    Obesity Mother    Kidney disease Mother    Hypothyroidism Mother    Heart failure Mother    Deep vein thrombosis Father    Hypertension Father    Diabetes Father    Obesity Father    Asthma Sister    Seizures Sister    Hypertension Maternal Grandmother    Heart disease Maternal Grandmother    Kidney disease Maternal Grandmother    Diabetes Maternal Grandmother    Hypertension Maternal Grandfather    Cancer Maternal Grandfather    Alzheimer's disease Paternal Grandmother    Diabetes Paternal Grandfather     Social History   Tobacco Use   Smoking status: Never   Smokeless tobacco: Never   Vaping Use   Vaping Use: Never used  Substance Use Topics   Alcohol use: Yes    Comment: Social   Drug use: No     No Known Allergies  Review of Systems NEGATIVE UNLESS OTHERWISE INDICATED IN HPI      Objective:     BP 123/83 (BP Location: Left Arm, Patient Position: Sitting, Cuff Size: Large)   Pulse 74   Temp 98 F (36.7 C) (Temporal)   Ht 5\' 6"  (1.676 m)   Wt 272 lb 8 oz (123.6 kg)   LMP 04/06/2022 (Exact Date)   SpO2 98%   Breastfeeding No   BMI 43.98 kg/m   Wt Readings from Last 3 Encounters:  04/27/22 272 lb 8 oz (123.6 kg)  06/08/21 270 lb 1 oz (122.5 kg)  05/14/21 270 lb (122.5 kg)    BP Readings from Last 3 Encounters:  04/27/22 123/83  06/10/21 (!) 143/73  05/15/21 122/76     Physical Exam Vitals and nursing note reviewed.  Constitutional:      Appearance: Normal appearance. She is obese. She is not toxic-appearing.  HENT:     Head: Normocephalic and atraumatic.  Eyes:     Extraocular Movements: Extraocular movements intact.  Conjunctiva/sclera: Conjunctivae normal.     Pupils: Pupils are equal, round, and reactive to light.  Cardiovascular:     Rate and Rhythm: Normal rate and regular rhythm.     Pulses: Normal pulses.     Heart sounds: Normal heart sounds.  Pulmonary:     Effort: Pulmonary effort is normal.     Breath sounds: Normal breath sounds.  Musculoskeletal:        General: Normal range of motion.     Cervical back: Normal range of motion and neck supple.  Skin:    General: Skin is warm and dry.  Neurological:     General: No focal deficit present.     Mental Status: She is alert and oriented to person, place, and time.  Psychiatric:        Mood and Affect: Mood normal.        Behavior: Behavior normal.        Thought Content: Thought content normal.        Judgment: Judgment normal.        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 PDMP reviewed today, no red flags, filling appropriately.  Stable on Adderall XR 15 mg BID, refilled today. F/up with me every 3 months.    Acquired hypothyroidism Assessment & Plan: Stable on levothyroxine 50 mcg daily. Plan to check labs at next visit.   Need for immunization against influenza -     Flu Vaccine QUAD 59mo+IM (Fluarix, Fluzone & Alfiuria Quad PF)  Other orders -     Amphetamine-Dextroamphet ER; Take 1 capsule by mouth 2 (two) times daily.  Dispense: 60 capsule; Refill: 0 -     Levothyroxine Sodium; Take 1 tablet (50 mcg total) by mouth daily before breakfast.  Dispense: 90 tablet; Refill: 1        Return in about 3 months (around 07/27/2022) for fasting labs, med recheck .  This note was prepared with assistance of Conservation officer, historic buildings. Occasional wrong-word or sound-a-like substitutions may have occurred due to the inherent limitations of voice recognition software.    Rotunda Worden M Carmichael Burdette, PA-C

## 2022-04-27 NOTE — Patient Instructions (Addendum)
Welcome to [facility]! It was a pleasure meeting you today.  As discussed, Please schedule a 3 month follow up visit today.  Medications refilled today.  Call sooner if any concerns   PLEASE NOTE:  If you had any LAB tests please let us know if you have not heard back within a few days. You may see your results on MyChart before we have a chance to review them but we will give you a call once they are reviewed by [place]. If we ordered any REFERRALS today, please let us know if you have not heard from their office within the next two weeks. Let us know through MyChart if you are needing REFILLS, or have your pharmacy send [place] the request. You can also use MyChart to communicate with me or any office staff.  Please try these tips to maintain a healthy lifestyle:  Eat most of your calories during the day when you are active. Eliminate processed foods including packaged sweets (pies, cakes, cookies), reduce intake of potatoes, white bread, white pasta, and white rice. Look for whole grain options, oat flour or almond flour.  Each meal should contain half fruits/vegetables, one quarter protein, and one quarter carbs (no bigger than a computer mouse).  Cut down on sweet beverages. This includes juice, soda, and sweet tea. Also watch fruit intake, though this is a healthier sweet option, it still contains natural sugar! Limit to 3 servings daily.  Drink at least 1 glass of water with each meal and aim for at least 8 glasses (64 ounces) per day.  Exercise at least 150 minutes every week to the best of your ability.    Take Care,  Chue Berkovich, PA-C

## 2022-04-27 NOTE — Assessment & Plan Note (Signed)
Stable on levothyroxine 50 mcg daily. Plan to check labs at next visit.

## 2022-04-27 NOTE — Assessment & Plan Note (Signed)
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 PDMP reviewed today, no red flags, filling appropriately.  Stable on Adderall XR 15 mg BID, refilled today. F/up with me every 3 months.

## 2022-05-06 ENCOUNTER — Telehealth: Payer: Self-pay | Admitting: Physician Assistant

## 2022-05-06 NOTE — Telephone Encounter (Signed)
Patient requests a new RX for Adderall -30 tablets - 1 tablet once a day (states for insurance purposes-coverage is for 30 tablets only)  Patient states she is out of the above medication.  Requests RX for above be sent asap to  [id number] - [place], [place] Phone: [phone]  Fax: [phone]

## 2022-05-07 NOTE — Telephone Encounter (Signed)
Patient returned my call and advised she was going to try the 15 mg XR with immediate release in the afternoons. Please send to [facility] per pt

## 2022-05-07 NOTE — Telephone Encounter (Signed)
Please see pt call note; fax placed in provider box

## 2022-05-07 NOTE — Telephone Encounter (Signed)
Called pt to discuss and see what option she prefers from provider recommendations, lvm with options and cb number and also advised she could reach out via [place] as well with her preference.

## 2022-05-10 ENCOUNTER — Other Ambulatory Visit: Payer: Self-pay | Admitting: Physician Assistant

## 2022-05-10 MED ORDER — AMPHETAMINE-DEXTROAMPHETAMINE 15 MG PO TABS: ORAL_TABLET | ORAL | 0 refills | Status: AC

## 2022-05-10 MED ORDER — AMPHETAMINE-DEXTROAMPHET ER 15 MG PO CP24: 15.0000 mg | ORAL_CAPSULE | Freq: Every morning | ORAL | 0 refills | Status: AC

## 2022-05-10 NOTE — Telephone Encounter (Signed)
Please see pt msg, didn't receive prescription for medication on Friday

## 2022-05-10 NOTE — Telephone Encounter (Signed)
Called pt to advise and lvm that Rx was sent to pharmacy today by provider, left cb number to return call with any questions

## 2022-05-10 NOTE — Telephone Encounter (Signed)
Pt called in stating medication was not called in.   Patient Name: [name] E Gender: Female DOB: Feb 08, 1986 Age: 36 Y 9 M 15 D Return Phone Number: [phone] (Primary) Address: City/ State/ Zip: [place]  [address] Client [facility] Client Site [facility] Type Call Who Is Calling Patient / Member / Family / Caregiver Call Type Triage / Clinical Relationship To Patient Self Return Phone Number [phone] (Primary) Chief Complaint Prescription Refill or Medication Request (non symptomatic) Reason for Call Medication Question / Request Initial Comment Caller states her prescription for Adderall was not called in to her pharmacy. Translation No Disp. Time Eilene Ghazi Time) Disposition Final User 05/08/2022 12:03:36 AM Send To Nurse Ferne Coe, RN, Maudry Mayhew 05/08/2022 12:36:14 AM FINAL ATTEMPT MADE - message left Files, RN, Rachel Bo 05/08/2022 12:36:26 AM Send to RN Final Attempt Queue Files, RN, Rachel Bo 05/08/2022 3:49:07 AM FINAL ATTEMPT MADE - no message left Yes Phoebe Perch, RN, Lisabeth Pick Final Disposition 05/08/2022 3:49:07 AM FINAL ATTEMPT MADE - no message left Yes Phoebe Perch, RN, Lisabeth Pick
